# Patient Record
Sex: Female | Born: 1975 | Race: White | Hispanic: No | Marital: Married | Smoking: Former smoker
Health system: Southern US, Community
[De-identification: ages and names within clinical notes are randomized; demographics above are authoritative.]

## PROBLEM LIST (undated history)

## (undated) DIAGNOSIS — I214 Non-ST elevation (NSTEMI) myocardial infarction: Secondary | ICD-10-CM

## (undated) DIAGNOSIS — Z720 Tobacco use: Secondary | ICD-10-CM

## (undated) DIAGNOSIS — E785 Hyperlipidemia, unspecified: Secondary | ICD-10-CM

## (undated) DIAGNOSIS — I2510 Atherosclerotic heart disease of native coronary artery without angina pectoris: Secondary | ICD-10-CM

## (undated) DIAGNOSIS — I10 Essential (primary) hypertension: Secondary | ICD-10-CM

## (undated) HISTORY — DX: Tobacco use: Z72.0

## (undated) HISTORY — DX: Hyperlipidemia, unspecified: E78.5

## (undated) HISTORY — PX: CHOLECYSTECTOMY: SHX55

## (undated) HISTORY — DX: Atherosclerotic heart disease of native coronary artery without angina pectoris: I25.10

---
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
## 2000-05-11 ENCOUNTER — Other Ambulatory Visit: Admission: RE | Admit: 2000-05-11 | Discharge: 2000-05-11 | Payer: Self-pay | Admitting: Obstetrics and Gynecology

## 2001-02-15 ENCOUNTER — Inpatient Hospital Stay (HOSPITAL_COMMUNITY): Admission: AD | Admit: 2001-02-15 | Discharge: 2001-02-15 | Payer: Self-pay | Admitting: Obstetrics and Gynecology

## 2001-02-16 ENCOUNTER — Inpatient Hospital Stay (HOSPITAL_COMMUNITY): Admission: AD | Admit: 2001-02-16 | Discharge: 2001-02-16 | Payer: Self-pay | Admitting: Obstetrics and Gynecology

## 2001-02-23 ENCOUNTER — Inpatient Hospital Stay (HOSPITAL_COMMUNITY): Admission: AD | Admit: 2001-02-23 | Discharge: 2001-02-25 | Payer: Self-pay | Admitting: Obstetrics and Gynecology

## 2001-04-08 ENCOUNTER — Other Ambulatory Visit: Admission: RE | Admit: 2001-04-08 | Discharge: 2001-04-08 | Payer: Self-pay | Admitting: Obstetrics and Gynecology

## 2002-03-17 ENCOUNTER — Other Ambulatory Visit: Admission: RE | Admit: 2002-03-17 | Discharge: 2002-03-17 | Payer: Self-pay | Admitting: Obstetrics and Gynecology

## 2002-09-22 ENCOUNTER — Inpatient Hospital Stay (HOSPITAL_COMMUNITY): Admission: AD | Admit: 2002-09-22 | Discharge: 2002-09-24 | Payer: Self-pay | Admitting: Obstetrics and Gynecology

## 2002-10-31 ENCOUNTER — Other Ambulatory Visit: Admission: RE | Admit: 2002-10-31 | Discharge: 2002-10-31 | Payer: Self-pay | Admitting: Obstetrics and Gynecology

## 2003-11-11 ENCOUNTER — Encounter (INDEPENDENT_AMBULATORY_CARE_PROVIDER_SITE_OTHER): Payer: Self-pay | Admitting: Specialist

## 2003-11-12 ENCOUNTER — Observation Stay (HOSPITAL_COMMUNITY): Admission: EM | Admit: 2003-11-12 | Discharge: 2003-11-12 | Payer: Self-pay | Admitting: Emergency Medicine

## 2004-06-16 ENCOUNTER — Other Ambulatory Visit: Admission: RE | Admit: 2004-06-16 | Discharge: 2004-06-16 | Payer: Self-pay | Admitting: Obstetrics and Gynecology

## 2004-09-13 ENCOUNTER — Other Ambulatory Visit: Admission: RE | Admit: 2004-09-13 | Discharge: 2004-09-13 | Payer: Self-pay | Admitting: Obstetrics and Gynecology

## 2005-07-13 ENCOUNTER — Other Ambulatory Visit: Admission: RE | Admit: 2005-07-13 | Discharge: 2005-07-13 | Payer: Self-pay | Admitting: Obstetrics and Gynecology

## 2006-01-25 ENCOUNTER — Inpatient Hospital Stay (HOSPITAL_COMMUNITY): Admission: AD | Admit: 2006-01-25 | Discharge: 2006-01-26 | Payer: Self-pay | Admitting: Obstetrics and Gynecology

## 2007-07-01 ENCOUNTER — Emergency Department (HOSPITAL_COMMUNITY): Admission: EM | Admit: 2007-07-01 | Discharge: 2007-07-01 | Payer: Self-pay | Admitting: Emergency Medicine

## 2010-02-08 ENCOUNTER — Encounter (INDEPENDENT_AMBULATORY_CARE_PROVIDER_SITE_OTHER): Payer: Self-pay | Admitting: Obstetrics and Gynecology

## 2010-02-08 ENCOUNTER — Inpatient Hospital Stay (HOSPITAL_COMMUNITY): Admission: RE | Admit: 2010-02-08 | Discharge: 2010-02-10 | Payer: Self-pay | Admitting: Family Medicine

## 2010-08-11 LAB — CBC
HCT: 30 % — ABNORMAL LOW (ref 36.0–46.0)
Hemoglobin: 10.4 g/dL — ABNORMAL LOW (ref 12.0–15.0)
Hemoglobin: 12 g/dL (ref 12.0–15.0)
MCH: 33.5 pg (ref 26.0–34.0)
RBC: 3.62 MIL/uL — ABNORMAL LOW (ref 3.87–5.11)
RDW: 13.7 % (ref 11.5–15.5)
WBC: 12 10*3/uL — ABNORMAL HIGH (ref 4.0–10.5)

## 2010-08-11 LAB — ABO/RH: ABO/RH(D): A POS

## 2010-08-11 LAB — BASIC METABOLIC PANEL
BUN: 5 mg/dL — ABNORMAL LOW (ref 6–23)
CO2: 22 mEq/L (ref 19–32)
Chloride: 105 mEq/L (ref 96–112)
GFR calc Af Amer: >60 mL/min (ref >60)
Glucose, Bld: 91 mg/dL (ref 70–99)
Potassium: 4.1 mEq/L (ref 3.5–5.1)
Sodium: 135 mEq/L (ref 135–145)

## 2010-08-11 LAB — SURGICAL PCR SCREEN: MRSA, PCR: NEGATIVE

## 2010-08-11 LAB — TYPE AND SCREEN: Antibody Screen: NEGATIVE

## 2010-10-14 NOTE — H&P (Signed)
   Isabel [name], Isabel [name]                        ACCOUNT NO.:  [id number]   MEDICAL RECORD NO.:  [id number]                   PATIENT TYPE:  INP   LOCATION:  9163                                 FACILITY:  [facility]   PHYSICIAN:  Dineen Kid. Rana Snare, M.D.                 DATE OF BIRTH:  1975-06-03   DATE OF ADMISSION:  09/22/2002  DATE OF DISCHARGE:                                HISTORY & PHYSICAL   HISTORY OF PRESENT ILLNESS:  The patient is a 35 year old G2 P1 at 37-2/7  weeks who was seen in the office today with continued contractions  throughout the weekend.  Her pregnancy has been complicated by preterm  labor; stopped her terbutaline at 36 weeks.  She has now had prolonged  latent labor, is miserable and unable to sleep and unable to function due to  the fact that she is contracting all the time and feeling pain and pressure.  She also had a large gestational infant with last ultrasound on 09/16/2002  showing a 7-pound 2-ounce boy with an AFI of 21.6.  Estimated date of  confinement is 10/11/2002.  She presents for augmentation of latent labor.   PAST MEDICAL HISTORY:  Negative.   PAST SURGICAL HISTORY:  Negative.   PAST OBSTETRICAL HISTORY:  She had a vaginal delivery which was a vacuum  assisted of a 7-pound 1-ounce infant in 2002.   MEDICATIONS:  Prenatal vitamins.   ALLERGIES:  She has no known drug allergies.   LABORATORIES:  Blood type is A positive, rubella immune, hepatitis B  negative.   PHYSICAL EXAMINATION:  VITAL SIGNS:  Blood pressure is 118/82.  ABDOMEN:  Gravid, nontender, vertex presentation.  CERVIX:  Closed, 50%, -3.   MONITORING:  Contractions every 5 minutes on the monitor.   IMPRESSION AND PLAN:  Intrauterine pregnancy at 37-1/2 weeks with prolonged  latent labor.  The patient adamantly desires augmentation due to favorable  cervix, prolonged labor, and discomfort due to the large gestational age  infant, understands the risk of prematurity or risk of  fetal lung immaturity  at 37-1/2 weeks and again wished to proceed with amniotomy.  Unknown group B  strep status, we will place her on IV antibiotics prophylaxis for unknown  group B strep, and anticipate vaginal delivery.                                               Dineen Kid Rana Snare, M.D.    DCL/MEDQ  D:  09/22/2002  T:  09/22/2002  Job:  [id number]

## 2010-10-14 NOTE — Op Note (Signed)
NAMECATHELEEN, Isabel [name]                        ACCOUNT NO.:  [id number]   MEDICAL RECORD NO.:  [id number]                   PATIENT TYPE:  INP   LOCATION:  0101                                 [facility]AC[facility]   PHYSICIAN:  Leonie Man, M.D.                DATE OF BIRTH:  [birth year]   DATE OF PROCEDURE:  11/11/2003  DATE OF DISCHARGE:                                 OPERATIVE REPORT   PREOPERATIVE DIAGNOSIS:  Acute cholecystitis.   POSTOPERATIVE DIAGNOSIS:  Acute cholecystitis.   PROCEDURE:  Laparoscopic cholecystectomy with intraoperative cholangiogram.   SURGEON:  Luisa Hart L. Lurene Shadow, M.D.   ASSISTANT:  Consuello Bossier, M.D.   ANESTHESIA:  General.   NOTE:  Isabel [name] is a 35 year old female, who began having severe right  upper quadrant pain, associated with nausea but without vomiting, and this  has been persistent since June 13.  She has a prior history of having had  some colicky symptoms in the past, but this had usually subsided within 24  hours.  This time, she was evaluated by abdominal ultrasound which showed  cholelithiasis with a stone wedged in the neck of the gallbladder.  She was  referred for evaluation and on evaluation, she was noted to be afebrile with  no leukocytosis.  She did have elevated liver function studies with her SGOT  162, SGPT 165; a total bilirubin was elevated to 1.6, and the alkaline  phosphatase was elevated to 185.   The risks and potential benefits of cholecystectomy were discussed with her  and with her husband.  They gave consent to surgery, and she comes now to  the operating room for laparoscopic cholecystectomy.   DESCRIPTION OF PROCEDURE:  Following the induction of satisfactory general  anesthesia with the patient positioned supinely, the abdomen is prepped and  draped to be included in a sterile operative field.  Open laparoscopy was  created by an infraumbilical incision with an insertion of a Hasson cannula.  Insufflation  of the peritoneal cavity to 14-15 mmHg pressure using carbon  dioxide.  Camera is inserted and visual exploration of the abdomen carried  out.  The liver edge was somewhat rounded.  Liver surface was smooth.  The  gallbladder was acutely inflamed.  There was a significant amount of free  fluid around the gallbladder.  The small and large intestine appeared to be  normal.  On visualization of the pelvis, the pelvic organs were not  visualized.  There were some small adhesions in the pelvis from previous  cesarean sections.  Under direct vision, epigastric and lateral ports are  placed.  The gallbladder was extremely tense and hydropic and had to be  drained by placing an aspiration trocar through the fundus of the  gallbladder.  This was aspirated of large amount of purulent-looking bile.  The gallbladder is grasped and then retracted cephalad.  It has multiple  adhesions to the gallbladder, taking  it in a phlegmon, were taken down.  Dissection was taken down to the ampulla, and there was good visualization  of the anatomy in this area.  The cystic duct and cystic artery were  dissected free, and the cystic artery was doubly clipped and transected, the  cystic duct clipped proximally and opened.  I did a cystic duct  cholangiogram by passing a Reddick catheter into the abdomen and injecting  one-half strength Hypaque through the cystic duct.  The resulting  cholangiogram showed extrahepatic biliary system of normal caliber with no  filling defect.  There was prompt passage of contrast into the duodenum.  The cholangiocatheter was withdrawn, and the cystic duct was triply clipped  and transected.  The gallbladder was then dissected free from the liver bed  using electrocautery and maintaining hemostasis throughout the course of the  dissection.  The gallbladder was somewhat woody and acutely inflamed with  significant amounts of edema around the gallbladder.  After removal of the   gallbladder, the liver bed was checked for hemostasis and additional  bleeding points treated with electrocautery.  The right upper quadrant was  then irrigated with multiple aliquots of saline and aspirated.  The  gallbladder was placed in an EndoCatch and retrieved through the umbilicus.  The pneumoperitoneum was then allowed to deflate after the lateral trocars  were removed under direct vision.  The pneumoperitoneum having been  deflated, the wounds were closed in layers as follows:  The umbilical wound  in two layers with 0 Vicryl and a 4-0 Monocryl.  Epigastric and lateral  flank wounds closed with running 4-0 Monocryl sutures.  Sterile dressings  were placed on the wounds.  The anesthetic is reversed and the patient  removed from the operating room to the recovery room in stable condition.  She tolerated the procedure well.                                               Leonie Man, M.D.    PB/MEDQ  D:  11/11/2003  T:  11/11/2003  Job:  [id number]   cc:   Dr. Lurene Shadow (2 copies)

## 2010-10-14 NOTE — Op Note (Signed)
NAMEQUIN, MCPHERSON                        ACCOUNT NO.:  [id number]   MEDICAL RECORD NO.:  [id number]                   PATIENT TYPE:  INP   LOCATION:  9163                                 FACILITY:  [facility]   PHYSICIAN:  Dineen Kid. Rana Snare, M.D.                 DATE OF BIRTH:  Sep 18, 1975   DATE OF PROCEDURE:  09/23/2002  DATE OF DISCHARGE:                                 OPERATIVE REPORT   PREOPERATIVE DIAGNOSES:  1. Intrauterine pregnancy at 37-1/2 weeks.  2. Failure to progress.  3. Fetal intolerance to labor.   POSTOPERATIVE DIAGNOSES:  1. Intrauterine pregnancy at 37-1/2 weeks.  2. Failure to progress.  3. Fetal intolerance to labor.   PROCEDURE:  Primary low segment transverse cesarean section.   SURGEON:  Dineen Kid. Rana Snare, M.D.   ANESTHESIA:  Epidural.   INDICATIONS:  The patient is a 35 year old G2, P1 at 37-1/2 weeks who  presented in early labor.  She progressed to 3-4 cm at which time she had a  hand presentation.  The hand was manually reduced.  The patient subsequently  progressed to 5 cm, completely effaced, and 0 station at which time she  continued to contract approximately 250 [place] units for three to four  hours with no cervical change beyond 5 cm.  Began to have cervical edema and  failed to progress beyond 5 cm dilated despite adequate contractions.  Fetus  began having deep variables and because of failure to progress and fetal  intolerance to labor planned to proceed with primary low segment transverse  cesarean section.  Risks and benefits were discussed.  Informed consent was  obtained.   FINDINGS:  Viable female infant.  Apgars were 9 and 9.  Weight 7 pounds 3  ounces.  pH arterial 7.41.   ESTIMATED BLOOD LOSS:  800 mL.   DESCRIPTION OF PROCEDURE:  After adequate analgesia the patient was placed  in the supine position with left lateral tilt.  She was sterilely prepped  and draped.  Pfannenstiel skin incision was made two fingerbreadths above  the  pubic symphysis, taken down sharply.  The fascia was incised  transversely, extended superiorly and inferiorly off the bellies of the  rectus muscle which separated sharply in midline.  Peritoneum was entered  sharply.  Bladder blade was placed.  Uterine serosa was elevated, nicked,  and incised transversely.  Bladder flap was created and replaced behind the  bladder blade.  A low segment myotomy incision was made down to the infant's  vertex, extended laterally with the operator's fingertips.  Infant's vertex  was then delivered atraumatically.  The nares and pharynx suctioned.  The  infant was then delivered.  Cord clamped, cut, and cord blood was obtained  and the infant was handed to the pediatricians for resuscitation.  Placenta  was extracted manually.  Uterus was exteriorized, wiped clean with a dry  lap.  Myotomy incision  was closed in three layers, the first being a running  locking layer, the second two layers being imbricating layers.  After  adequate hemostasis was assured uterus was placed back into the peritoneal  cavity.  After copious amount of irrigation and adequate hemostasis the  peritoneum was closed with 0 Monocryl.  Rectus muscle plicated in the  midline.  Irrigation was applied and after adequate hemostasis the fascia  was closed with a 0 PDS in a running fashion.  Irrigation was applied and  after adequate hemostasis the skin was stapled, Steri-Strips applied.  The  patient tolerated the procedure well.  Was stable on transfer to the  recovery room.  Sponge and instrument count was normal x3.  Estimated blood  loss during the procedure was 800 mL.  The patient received 1 g of Cefotetan  after delivery of the placenta.                                               Dineen Kid Rana Snare, M.D.    DCL/MEDQ  D:  09/23/2002  T:  09/23/2002  Job:  [id number]

## 2010-10-14 NOTE — Discharge Summary (Signed)
NAMEJELIYAH, MIDDLEBROOKS                        ACCOUNT NO.:  [id number]   MEDICAL RECORD NO.:  [id number]                   PATIENT TYPE:  INP   LOCATION:  9119                                 FACILITY:  [facility]   PHYSICIAN:  Michelle L. Vincente Poli, M.D.            DATE OF BIRTH:  05-21-1976   DATE OF ADMISSION:  09/22/2002  DATE OF DISCHARGE:  09/24/2002                                 DISCHARGE SUMMARY   ADMISSION DIAGNOSES:  1. Intrauterine pregnancy at 76 and 2/7 weeks estimated gestational age.  2. Spontaneous onset of labor.   DISCHARGE DIAGNOSES:  1. Status post low transverse cesarean section secondary to failure to     progress and fetal intolerance to labor.  2. Viable female infant.   PROCEDURE:  Primary low transverse cesarean section.   REASON FOR ADMISSION:  Please see written H&P.   HOSPITAL COURSE:  The patient was a 35 year old white married female gravida  2, para 1 who presented to [facility] at 25 1/2 weeks estimated  gestational age in early labor.  Group B beta strep culture results were  unknown and the patient was treated prophylactically with antibiotics.  Artificial rupture of membranes was performed revealing clear fluid.  Fetal  heart tones were reassuring.  The patient did progress to 3-4 cm dilated at  which time she was noted to have a hand presentation.  The hand with manual  labor reduced.  The patient did progress to 5 cm and 100% effaced with  vertex at a 0 station.  After three to four hours of inadequate labor as  determined by intrauterine pressure catheter, the patient did not note  further progress.  Cervix began to become edematous and fetus was noted to  have deep variable decelerations on the monitor.  Decision was then made to  proceed with cesarean delivery.  The patient was taken to the operating room  where epidural was dosed to an adequate surgical level.  A low transverse  incision was made with a delivery of a viable female infant  weighing 7 pounds  3 ounces with Apgar's of 9 at one minute and 9 at five minutes.  Umbilical  cord pH was 7.41.  The patient tolerated procedure well and was taken to the  recovery room in stable condition.  Later that morning, the patient was then  transferred to the mother baby unit where vital signs were stable and the  patient was afebrile.  Fundus was firm and nontender.  Abdomen was soft.  She had good return of bowel function.  Abdominal  dressing was clean, dry  and intact.  On the following morning, the patient was without complaint.  She desired early discharge.  Vital signs were stable.  She was afebrile.  Fundus was firm and nontender.  There was a small amount of drainage noted  on abdominal dressing which was renewed and revealed an incision that clean,  dry and intact.   Labs revealed hemoglobin 8.4, platelet count 213,000.  WBC count 11.8.  The  patient did tolerate a regular diet without  complaints of nausea and  vomiting and the patient was later discharged that afternoon.   CONDITION ON DISCHARGE:  Good.   DIET:  Regular as tolerated.   ACTIVITY:  No heavy lifting, no driving x 2 weeks, no vaginal entry.   FOLLOW UP:  The patient is to follow up in the office in one week for an  incision check.  She is to call for temperature greater than 100 degrees,  persistent nausea and vomiting, heavy vaginal bleeding, any redness,  drainage from incisional site.   DISCHARGE MEDICATIONS:  1. Percocet 5/325 #30, one p.o. every four to six hours p.r.n. pain.  2. Motrin 600 mg every six hours p.r.n.  3. Prenatal vitamins one p.o. daily.  4. Colace one p.o. daily p.r.n.     Julio Sicks, N.P.                        Stann Mainland. Vincente Poli, M.D.    CC/MEDQ  D:  10/28/2002  T:  10/28/2002  Job:  [id number]

## 2010-10-14 NOTE — H&P (Signed)
Isabel [name], Isabel [name]              ACCOUNT NO.:  [id number]   MEDICAL RECORD NO.:  [id number]          PATIENT TYPE:  INP   LOCATION:  NA                            FACILITY:  [facility]   PHYSICIAN:  Dineen Kid. Rana Snare, M.D.    DATE OF BIRTH:  Feb 18, 1976   DATE OF ADMISSION:  01/25/2006  DATE OF DISCHARGE:                                HISTORY & PHYSICAL   HISTORY OF PRESENT ILLNESS:  Ms. Lippold is a 35 year old G3, P2 with EDC  of February 08, 2006, who presents for repeat cesarean section.  Her  pregnancy has been complicated by preterm labor and early labor, also  complicated by hypertension, currently on atenolol, history of migraines.  She has been on terbutaline until 36 weeks.  She now takes it as needed to  get to the scheduled C-section date.  Her cervix is 1+ long and -3.  She  track spots about 7 times an hour.  Ultrasound on January 08, 2006 shows  vertex presentation with a estimated fetal weight of 5 pounds, 12 ounces.  She has had regular followup throughout at the office with ultrasounds and  nonstress tests due to her hypertension.  Also, preterm labor.  She desires  for repeat cesarean section and presents for this.   PAST MEDICAL HISTORY:  Significant for hypertension.  Also, migraines.  Currently on atenolol 50 mg a day.  She had a cholecystectomy in June, 2005  with a C-section in 2004.  She desires repeat cesarean section.   MEDICATIONS:  Atenolol 50 mg a day.  Prenatal vitamins p.o. daily.   She has no known drug allergies.   PHYSICAL EXAMINATION:  VITAL SIGNS:  Blood pressure 100/74  LUNGS:  Clear to auscultation bilaterally.  HEART:  Regular rate and rhythm.  ABDOMEN:  Gravid, nontender.  PELVIC:  Cervix is 1, long, and -3 station.  Fetal heart tones in the 150s.   IMPRESSION/PLAN:  Intrauterine pregnancy at [redacted] weeks gestational age,  previous cesarean section, desires repeat cesarean section, preterm labor  and early labor.  Plan to proceed with repeat cesarean  section due to  regular contractions and early labor.  Risks and benefits of the procedure  were discussed at length, which included but were not limited to risk of  infection, bleeding, damage to the uterus, tubes, cervix, bowel, bladder,  fetus.  She does give her informed consent and wishes to proceed.      Dineen Kid Rana Snare, M.D.  Electronically Signed     DCL/MEDQ  D:  01/24/2006  T:  01/24/2006  Job:  [id number]

## 2010-10-14 NOTE — H&P (Signed)
NAMEEDLA, PARA                        ACCOUNT NO.:  [id number]   MEDICAL RECORD NO.:  [id number]                   PATIENT TYPE:  INP   LOCATION:  0101                                 FACILITY:  [facility]   PHYSICIAN:  Leonie Man, M.D.                DATE OF BIRTH:  18-Sep-1975   DATE OF ADMISSION:  11/11/2003  DATE OF DISCHARGE:                                HISTORY & PHYSICAL   CHIEF COMPLAINT:  Cholecystitis, cholelithiasis.   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 The pain has continued to worsen over this time and on abdominal  ultrasound, she is noted to have a cholelithiasis with stones wedge within  the neck off the gallbladder. She is admitted now and on admission she has  no leukocytosis although white blood cell count being 8.2, hemoglobin 12.5  and platelets 367.  Liver function studies are however abnormal with total  bilirubin elevated to 1.6, alkaline phosphatase 185, SGPT 165, SGOT 162.  Amylase is 42 and normal, lipase is 20 and normal. The patient did have a  prior history of having had some biliary colic which never lasted more than  24 hours and the pain was not quite as severe. She is a gravida 2, para 2  female.   PAST MEDICAL HISTORY:  Past surgery, cesarean section x1, spontaneous  vaginal delivery x1.   CURRENT MEDICATIONS:  Ketoprofen p.r.n.   ALLERGIES:  No known allergies.   REVIEW OF SYMPTOMS:  Negative in detail except as outlined.   PHYSICAL EXAMINATION:  GENERAL:  Well-developed and well-nourished white  female. She weighs approximately 200 pounds. She is alert and in no acute  distress.  VITAL SIGNS:  She is afebrile with temperature 98.1, pulses 97 and regular,  respirations 20, blood pressure 138/94.  HEENT:  Her head is normocephalic, her pupils are equal. The sclerae is  anicteric. No nasal obstruction, benign  oropharynx.  NECK:  Supple, no thyromegaly, no adenopathy.  CHEST:  Clear to auscultation bilaterally.  BREASTS:  Not examined.  HEART:  Regular rate and rhythm without murmurs heard.  ABDOMEN:  Tender in the epigastrium and the right upper quadrant. On  palpation, there is some degree of peritoneal irritation noted.  EXTREMITIES:  Range of motion within normal limits, no calf tenderness.  Distal pulses are equal bilaterally. Skin is normal.  NEUROLOGIC:  Gross neurologic examination shows no motor sensory deficits.   IMPRESSION:  Acute cholecystitis, rule out choledocholithiasis.   PLAN:  Laparoscopic cholecystectomy with intraoperative cholangiogram to be  attempted. I have discussed this with the patient and her spouse and consent  is obtained for surgery.  Leonie Man, M.D.    PB/MEDQ  D:  11/11/2003  T:  11/12/2003  Job:  [id number]

## 2010-10-14 NOTE — Discharge Summary (Signed)
Isabel [name], Isabel [name]              ACCOUNT NO.:  [id number]   MEDICAL RECORD NO.:  [id number]          PATIENT TYPE:  INP   LOCATION:                                FACILITY:  [facility]   PHYSICIAN:  Zelphia Cairo, MD         DATE OF BIRTH:   DATE OF ADMISSION:  01/25/2006  DATE OF DISCHARGE:  01/26/2006                                 DISCHARGE SUMMARY   ADMISSION DIAGNOSES:  1. Intrauterine pregnancy at 15 weeks estimated gestational age.  2. Early labor.  3. Previous cesarean section,  desires repeat for breech presentation.   DISCHARGE DIAGNOSIS:  1. Status post low transverse cesarean section.  2. Viable female infant.   PROCEDURE:  Repeat low transverse cesarean section.   REASON FOR ADMISSION:  Please see dictated H&P.   HOSPITAL COURSE:  The patient is a 35 year old gravida 3, para 2 who was  admitted to [facility] for a scheduled cesarean section.  The patient had a previous cesarean section and desired a repeat.  On the  morning of admission the patient was taken to the operating room where  spinal anesthesia was administered without difficulty.  A low transverse  incision was made, with the delivery of a viable female infant weighing 6  pounds 2 ounces with Apgars of 9 at one minute and 9 at five minutes.  The  patient tolerated  the procedure well and was taken to the recovery room in  stable condition.  On postoperative day one, the patient was without  complaints.  She did desire early discharge.   PHYSICAL EXAMINATION:  VITAL SIGNS:  Vital signs were stable.  Blood  pressure 92-114 over 51-52.  ABDOMEN:  Abdomen was soft.  Fundus firm and nontender.  Abdominal dressing  was mostly saturated with old drainage.  GENITOURINARY:  The Foley had been discontinued and the patient was voiding  well.   LABORATORY DATA:  The laboratory findings revealed a hemoglobin of 10.2,  platelet count 183,000, WBC count of 10.9.   The patient had  a chronic  hypertension and had been on atenolol.  The dose  was changed to 25 mg. The dressing was removed and IV was discontinued.  Later that evening the patient continued to desire early discharge.   DISCHARGE PHYSICAL EXAMINATION:  VITAL SIGNS:  Vital signs remained stable.  Blood pressure was now 110-114 over 51-65.  ABDOMEN:  Abdomen soft.  Incision was noted to be clean, dry and intact.   DISPOSITION:  Discharge instructions reviewed and the patient was later  discharged home.   CONDITION ON DISCHARGE:  Stable.   DIET:  Regular as tolerated.   ACTIVITY:  No heavy lifting, no driving x2 weeks, no vaginal entry.   FOLLOW UP:  Patient is to follow up in the office in three days for a blood  pressure check and staple removal.  She is to call for temperature greater  than 100 degrees, persistent __________ or  heavy vaginal bleeding and/or  redness or drainage from the incisional site.   DISCHARGE MEDICATIONS:  1. Atenolol 25  mg, one p.o. daily.  2. Tylox #30, one p.o. every 4-6 hours p.r.n.  3. Motrin 600 mg q.6h.  4. Colace one daily.      Julio Sicks, N.P.      Zelphia Cairo, MD  Electronically Signed    CC/MEDQ  D:  02/15/2006  T:  02/16/2006  Job:  [id number]

## 2010-10-14 NOTE — Op Note (Signed)
Isabel [name], Isabel [name]              ACCOUNT NO.:  [id number]   MEDICAL RECORD NO.:  [id number]          PATIENT TYPE:  INP   LOCATION:  9199                          FACILITY:  [facility]   PHYSICIAN:  Dineen Kid. Rana Snare, M.D.    DATE OF BIRTH:  05-May-1976   DATE OF PROCEDURE:  01/25/2006  DATE OF DISCHARGE:                                 OPERATIVE REPORT   PREOPERATIVE DIAGNOSES:  Intrauterine pregnancy at 38 weeks, early labor,  previous cesarean section and now breech presentation.   POSTOPERATIVE DIAGNOSES:  Intrauterine pregnancy at 38 weeks, early labor,  previous cesarean section and now breech presentation.   PROCEDURE:  Repeat low transverse caesarean section   SURGEON:  Dineen Kid. Rana Snare, M.D.   ANESTHESIA:  Spinal.   INDICATIONS:  Ms. Lober is a 35 year old G3, P2 at [redacted] weeks gestational  age.  Her pregnancy  has been complicated by a preterm labor and  hypertension, currently on Atenolol.  The cervix is currently 1, -3,  contractions up to 3 minutes now with breech presentation at 38 weeks.  Planned repeat cesarean section.  Risks and benefits were discussed and  informed consent was obtained.   FINDINGS:  At time of surgery, viable female infant in the frank breech  position.  Apgars were 9 and 9, pH arterial currently pending.  Weight is 6  pounds 2 ounces.   DESCRIPTION OF PROCEDURE:  After adequate analgesia, the patient was placed  in the supine position, left lateral tilt.  She was sterilely prepped and  draped.  The bladder was sterilely drained.  Previously sustained skin  incision was sharply excised.  It was then taken sharply down to the fascia  which was incised transversely and superiorly and inferiorly off the body of  the rectus muscles which were separated sharply in the midline.  Peritoneum  was entered sharply.  Bladder flap created and placed behind the bladder  blade. A low __________ incision was made down to the amniotic sac, extended  laterally with the  operator's fingertips.  The buttocks were easily  delivered up to the shoulder.  Arms were easily reduced, hip was easily  reduced as well.  The cord was clamped and cut.  Baby was bulb suctioned and  handed to the pediatrician for resuscitation.  Cord blood was obtained,  placenta extracted manually.  Uterus was exteriorized and wiped clean with  dry laps.  The myotomy incision was closed in two layers with first layer in  running locking layer and second imbricating layer of 0 Monocryl suture.  Uterus was placed back into the peritoneal cavity and after copious amount  of irrigation and adequate hemostasis was assured, the peritoneum was closed  with 0 Monocryl.  Rectus muscle was plicated in the midline.  Irrigation was  applied and after adequate hemostasis, the fascia was closed with 0 PDS in  running fashion.  Irrigation once again applied and after adequate hemostasis, skin staples  and Steri-Strips were applied.  The patient tolerated the procedure well,  was stable on transfer to the recovery room.  Sponge and instrument counts  were correct x3.  Estimated blood loss was 100 cc.  The patient received 1 g  of Rocephin after delivery of placenta.      Dineen Kid Rana Snare, M.D.  Electronically Signed     DCL/MEDQ  D:  01/25/2006  T:  01/25/2006  Job:  [id number]

## 2010-12-09 ENCOUNTER — Other Ambulatory Visit: Payer: Self-pay | Admitting: Obstetrics and Gynecology

## 2010-12-09 DIAGNOSIS — E878 Other disorders of electrolyte and fluid balance, not elsewhere classified: Secondary | ICD-10-CM

## 2010-12-09 DIAGNOSIS — R5383 Other fatigue: Secondary | ICD-10-CM

## 2010-12-09 DIAGNOSIS — R51 Headache: Secondary | ICD-10-CM

## 2010-12-13 ENCOUNTER — Emergency Department (HOSPITAL_COMMUNITY): Payer: Self-pay

## 2010-12-13 ENCOUNTER — Emergency Department (HOSPITAL_COMMUNITY)
Admission: EM | Admit: 2010-12-13 | Discharge: 2010-12-13 | Disposition: A | Discharge status: Home / Self Care | Payer: Self-pay | Attending: Emergency Medicine | Admitting: Emergency Medicine

## 2010-12-13 DIAGNOSIS — E785 Hyperlipidemia, unspecified: Secondary | ICD-10-CM | POA: Insufficient documentation

## 2010-12-13 DIAGNOSIS — I10 Essential (primary) hypertension: Secondary | ICD-10-CM | POA: Insufficient documentation

## 2010-12-13 DIAGNOSIS — R079 Chest pain, unspecified: Secondary | ICD-10-CM | POA: Insufficient documentation

## 2010-12-15 ENCOUNTER — Other Ambulatory Visit (HOSPITAL_COMMUNITY): Payer: Self-pay | Admitting: Obstetrics and Gynecology

## 2010-12-21 ENCOUNTER — Other Ambulatory Visit: Payer: Self-pay | Admitting: Obstetrics and Gynecology

## 2011-02-17 LAB — DIFFERENTIAL
Basophils Absolute: 0
Eosinophils Absolute: 0
Lymphs Abs: 0.6 — ABNORMAL LOW
Monocytes Absolute: 0.3
Monocytes Relative: 3
Neutro Abs: 10.3 — ABNORMAL HIGH
Neutrophils Relative %: 92 — ABNORMAL HIGH

## 2011-02-17 LAB — URINALYSIS, ROUTINE W REFLEX MICROSCOPIC
Bilirubin Urine: NEGATIVE
Leukocytes, UA: NEGATIVE
Nitrite: NEGATIVE
Specific Gravity, Urine: 1.033 — ABNORMAL HIGH

## 2011-02-17 LAB — COMPREHENSIVE METABOLIC PANEL
ALT: 13
Alkaline Phosphatase: 106
BUN: 6
Glucose, Bld: 106 — ABNORMAL HIGH
Potassium: 3.2 — ABNORMAL LOW
Total Protein: 7.4

## 2011-02-17 LAB — RPR: RPR Ser Ql: NONREACTIVE

## 2011-02-17 LAB — GC/CHLAMYDIA PROBE AMP, GENITAL
Chlamydia, DNA Probe: NEGATIVE
GC Probe Amp, Genital: NEGATIVE

## 2011-02-17 LAB — WET PREP, GENITAL
Clue Cells Wet Prep HPF POC: NONE SEEN
Trich, Wet Prep: NONE SEEN
Yeast Wet Prep HPF POC: NONE SEEN

## 2011-02-17 LAB — CBC
Hemoglobin: 13.2
MCHC: 34.6
RDW: 12.7
WBC: 11.2 — ABNORMAL HIGH

## 2011-02-17 LAB — PREGNANCY, URINE: Preg Test, Ur: NEGATIVE

## 2011-02-17 LAB — URINE MICROSCOPIC-ADD ON

## 2011-10-27 ENCOUNTER — Other Ambulatory Visit: Payer: Self-pay | Admitting: Obstetrics and Gynecology

## 2014-12-30 ENCOUNTER — Other Ambulatory Visit: Payer: Self-pay | Admitting: Obstetrics and Gynecology

## 2014-12-31 LAB — CYTOLOGY - PAP

## 2018-12-17 ENCOUNTER — Emergency Department (HOSPITAL_COMMUNITY)
Admission: EM | Admit: 2018-12-17 | Discharge: 2018-12-18 | Discharge status: Against Medical Advice | Payer: Medicaid Other | Source: Home / Self Care

## 2018-12-17 ENCOUNTER — Other Ambulatory Visit: Payer: Self-pay

## 2018-12-17 ENCOUNTER — Encounter (HOSPITAL_COMMUNITY): Payer: Self-pay

## 2018-12-17 ENCOUNTER — Emergency Department (HOSPITAL_COMMUNITY): Payer: Medicaid Other

## 2018-12-17 DIAGNOSIS — Z5321 Procedure and treatment not carried out due to patient leaving prior to being seen by health care provider: Secondary | ICD-10-CM | POA: Insufficient documentation

## 2018-12-17 LAB — BASIC METABOLIC PANEL
Anion gap: 9 (ref 5–15)
BUN: 8 mg/dL (ref 6–20)
CO2: 25 mmol/L (ref 22–32)
Calcium: 9.3 mg/dL (ref 8.9–10.3)
Chloride: 106 mmol/L (ref 98–111)
Creatinine, Ser: 0.83 mg/dL (ref 0.44–1.00)
GFR calc Af Amer: >60 mL/min (ref >60)
GFR calc non Af Amer: >60 mL/min (ref >60)
Glucose, Bld: 115 mg/dL — ABNORMAL HIGH (ref 70–99)
Potassium: 3.1 mmol/L — ABNORMAL LOW (ref 3.5–5.1)
Sodium: 140 mmol/L (ref 135–145)

## 2018-12-17 LAB — CBC
HCT: 43.2 % (ref 36.0–46.0)
Hemoglobin: 14.2 g/dL (ref 12.0–15.0)
MCH: 32.1 pg (ref 26.0–34.0)
MCHC: 32.9 g/dL (ref 30.0–36.0)
MCV: 97.7 fL (ref 80.0–100.0)
Platelets: 264 10*3/uL (ref 150–400)
RBC: 4.42 MIL/uL (ref 3.87–5.11)
RDW: 12.1 % (ref 11.5–15.5)
WBC: 11 10*3/uL — ABNORMAL HIGH (ref 4.0–10.5)
nRBC: 0 % (ref 0.0–0.2)

## 2018-12-17 LAB — TROPONIN I (HIGH SENSITIVITY): Troponin I (High Sensitivity): 23 ng/L — ABNORMAL HIGH (ref <18)

## 2018-12-17 LAB — I-STAT BETA HCG BLOOD, ED (MC, WL, AP ONLY): I-stat hCG, quantitative: <5 m[IU]/mL (ref <5)

## 2018-12-17 MED ORDER — SODIUM CHLORIDE 0.9% FLUSH: 3.0000 mL | Freq: Once | INTRAVENOUS | Status: DC

## 2018-12-17 NOTE — ED Notes (Signed)
HusbandCorene Cornea, [phone] for updates

## 2018-12-17 NOTE — ED Triage Notes (Signed)
Pt c.o chest pain that started around 330pm, pain radiates through to her back. Denies any other symptoms. Pt took a klonopin around 530pm to see if it would help with her symptoms and it did not. Pt a.o, nad noted

## 2018-12-18 NOTE — ED Notes (Signed)
Pt to the desk, stated she was leaving. Encouraged to stay, pt declined.

## 2018-12-19 ENCOUNTER — Inpatient Hospital Stay (HOSPITAL_COMMUNITY)
Admission: EM | Admit: 2018-12-19 | Discharge: 2018-12-21 | DRG: 247 | Disposition: A | Discharge status: Home / Self Care | Payer: Medicaid Other | Attending: Internal Medicine | Admitting: Internal Medicine

## 2018-12-19 ENCOUNTER — Encounter (HOSPITAL_COMMUNITY): Payer: Self-pay

## 2018-12-19 ENCOUNTER — Emergency Department (HOSPITAL_COMMUNITY): Payer: Medicaid Other

## 2018-12-19 ENCOUNTER — Other Ambulatory Visit: Payer: Self-pay

## 2018-12-19 DIAGNOSIS — F17210 Nicotine dependence, cigarettes, uncomplicated: Secondary | ICD-10-CM | POA: Diagnosis not present

## 2018-12-19 DIAGNOSIS — I160 Hypertensive urgency: Secondary | ICD-10-CM | POA: Diagnosis not present

## 2018-12-19 DIAGNOSIS — I10 Essential (primary) hypertension: Secondary | ICD-10-CM | POA: Diagnosis present

## 2018-12-19 DIAGNOSIS — Z1159 Encounter for screening for other viral diseases: Secondary | ICD-10-CM

## 2018-12-19 DIAGNOSIS — I214 Non-ST elevation (NSTEMI) myocardial infarction: Principal | ICD-10-CM | POA: Diagnosis present

## 2018-12-19 DIAGNOSIS — E785 Hyperlipidemia, unspecified: Secondary | ICD-10-CM

## 2018-12-19 DIAGNOSIS — I2510 Atherosclerotic heart disease of native coronary artery without angina pectoris: Secondary | ICD-10-CM

## 2018-12-19 DIAGNOSIS — E876 Hypokalemia: Secondary | ICD-10-CM | POA: Diagnosis not present

## 2018-12-19 DIAGNOSIS — I361 Nonrheumatic tricuspid (valve) insufficiency: Secondary | ICD-10-CM | POA: Diagnosis not present

## 2018-12-19 DIAGNOSIS — Z955 Presence of coronary angioplasty implant and graft: Secondary | ICD-10-CM

## 2018-12-19 HISTORY — DX: Essential (primary) hypertension: I10

## 2018-12-19 HISTORY — DX: Non-ST elevation (NSTEMI) myocardial infarction: I21.4

## 2018-12-19 LAB — SARS CORONAVIRUS 2 BY RT PCR (HOSPITAL ORDER, PERFORMED IN ~~LOC~~ HOSPITAL LAB): SARS Coronavirus 2: NEGATIVE

## 2018-12-19 LAB — BASIC METABOLIC PANEL
Anion gap: 13 (ref 5–15)
BUN: 12 mg/dL (ref 6–20)
CO2: 22 mmol/L (ref 22–32)
Calcium: 9.2 mg/dL (ref 8.9–10.3)
Chloride: 105 mmol/L (ref 98–111)
Creatinine, Ser: 0.96 mg/dL (ref 0.44–1.00)
GFR calc Af Amer: >60 mL/min (ref >60)
GFR calc non Af Amer: >60 mL/min (ref >60)
Glucose, Bld: 106 mg/dL — ABNORMAL HIGH (ref 70–99)
Potassium: 3.1 mmol/L — ABNORMAL LOW (ref 3.5–5.1)
Sodium: 140 mmol/L (ref 135–145)

## 2018-12-19 LAB — CBC
HCT: 41.8 % (ref 36.0–46.0)
Hemoglobin: 14.3 g/dL (ref 12.0–15.0)
MCH: 33 pg (ref 26.0–34.0)
MCHC: 34.2 g/dL (ref 30.0–36.0)
MCV: 96.5 fL (ref 80.0–100.0)
Platelets: 229 10*3/uL (ref 150–400)
RBC: 4.33 MIL/uL (ref 3.87–5.11)
RDW: 12 % (ref 11.5–15.5)
WBC: 9.9 10*3/uL (ref 4.0–10.5)
nRBC: 0 % (ref 0.0–0.2)

## 2018-12-19 LAB — TROPONIN I (HIGH SENSITIVITY)
Troponin I (High Sensitivity): 776 ng/L (ref <18)
Troponin I (High Sensitivity): 917 ng/L (ref <18)

## 2018-12-19 LAB — I-STAT BETA HCG BLOOD, ED (MC, WL, AP ONLY): I-stat hCG, quantitative: <5 m[IU]/mL (ref <5)

## 2018-12-19 MED ORDER — ASPIRIN EC 81 MG PO TBEC: 81.0000 mg | DELAYED_RELEASE_TABLET | Freq: Every day | ORAL | Status: DC

## 2018-12-19 MED ORDER — POTASSIUM CHLORIDE CRYS ER 20 MEQ PO TBCR
60.0000 meq | EXTENDED_RELEASE_TABLET | Freq: Once | ORAL | Status: AC
  Administered 2018-12-19: 60 meq via ORAL
  Filled 2018-12-19: qty 3

## 2018-12-19 MED ORDER — HYDRALAZINE HCL 20 MG/ML IJ SOLN
10.0000 mg | Freq: Once | INTRAMUSCULAR | Status: AC
  Administered 2018-12-19: 10 mg via INTRAVENOUS
  Filled 2018-12-19: qty 1

## 2018-12-19 MED ORDER — ATORVASTATIN CALCIUM 80 MG PO TABS: 80.0000 mg | ORAL_TABLET | Freq: Every day | ORAL | Status: DC

## 2018-12-19 MED ORDER — SODIUM CHLORIDE 0.9% FLUSH
3.0000 mL | Freq: Two times a day (BID) | INTRAVENOUS | Status: DC
  Administered 2018-12-19: 3 mL via INTRAVENOUS

## 2018-12-19 MED ORDER — SODIUM CHLORIDE 0.9 % IV SOLN: 250.0000 mL | INTRAVENOUS | Status: DC | PRN

## 2018-12-19 MED ORDER — AMLODIPINE BESYLATE 2.5 MG PO TABS
2.5000 mg | ORAL_TABLET | Freq: Every day | ORAL | Status: DC
  Administered 2018-12-19 (×2): 2.5 mg via ORAL
  Filled 2018-12-19 (×2): qty 1

## 2018-12-19 MED ORDER — SODIUM CHLORIDE 0.9 % WEIGHT BASED INFUSION
3.0000 mL/kg/h | INTRAVENOUS | Status: DC
  Administered 2018-12-20: 04:00:00 3 mL/kg/h via INTRAVENOUS

## 2018-12-19 MED ORDER — LABETALOL HCL 5 MG/ML IV SOLN
0.5000 mg/min | INTRAVENOUS | Status: DC
  Filled 2018-12-19: qty 100

## 2018-12-19 MED ORDER — SODIUM CHLORIDE 0.9 % WEIGHT BASED INFUSION: 1.0000 mL/kg/h | INTRAVENOUS | Status: DC

## 2018-12-19 MED ORDER — METOPROLOL TARTRATE 5 MG/5ML IV SOLN
5.0000 mg | Freq: Four times a day (QID) | INTRAVENOUS | Status: DC | PRN
  Administered 2018-12-20: 5 mg via INTRAVENOUS

## 2018-12-19 MED ORDER — METOPROLOL TARTRATE 25 MG PO TABS
25.0000 mg | ORAL_TABLET | Freq: Four times a day (QID) | ORAL | Status: DC
  Administered 2018-12-19 – 2018-12-21 (×6): 25 mg via ORAL
  Filled 2018-12-19 (×6): qty 1

## 2018-12-19 MED ORDER — ONDANSETRON HCL 4 MG/2ML IJ SOLN: 4.0000 mg | Freq: Four times a day (QID) | INTRAMUSCULAR | Status: DC | PRN

## 2018-12-19 MED ORDER — SPIRONOLACTONE 25 MG PO TABS
25.0000 mg | ORAL_TABLET | Freq: Two times a day (BID) | ORAL | Status: DC
  Administered 2018-12-19 – 2018-12-21 (×4): 25 mg via ORAL
  Filled 2018-12-19 (×4): qty 1

## 2018-12-19 MED ORDER — NITROGLYCERIN 0.4 MG SL SUBL: 0.4000 mg | SUBLINGUAL_TABLET | SUBLINGUAL | Status: DC | PRN

## 2018-12-19 MED ORDER — ASPIRIN 81 MG PO CHEW
324.0000 mg | CHEWABLE_TABLET | Freq: Once | ORAL | Status: AC
  Administered 2018-12-19: 17:00:00 324 mg via ORAL
  Filled 2018-12-19: qty 4

## 2018-12-19 MED ORDER — SODIUM CHLORIDE 0.9% FLUSH: 3.0000 mL | INTRAVENOUS | Status: DC | PRN

## 2018-12-19 MED ORDER — ZOLPIDEM TARTRATE 5 MG PO TABS: 5.0000 mg | ORAL_TABLET | Freq: Every evening | ORAL | Status: DC | PRN

## 2018-12-19 MED ORDER — AMLODIPINE BESYLATE 5 MG PO TABS
2.5000 mg | ORAL_TABLET | Freq: Once | ORAL | Status: AC
  Administered 2018-12-19: 2.5 mg via ORAL

## 2018-12-19 MED ORDER — ALPRAZOLAM 0.25 MG PO TABS
0.2500 mg | ORAL_TABLET | Freq: Two times a day (BID) | ORAL | Status: DC | PRN
  Administered 2018-12-20: 0.25 mg via ORAL

## 2018-12-19 MED ORDER — HYDRALAZINE HCL 20 MG/ML IJ SOLN: 10.0000 mg | INTRAMUSCULAR | Status: DC | PRN

## 2018-12-19 MED ORDER — METOPROLOL TARTRATE 5 MG/5ML IV SOLN
5.0000 mg | Freq: Once | INTRAVENOUS | Status: AC
  Administered 2018-12-19: 5 mg via INTRAVENOUS
  Filled 2018-12-19: qty 5

## 2018-12-19 MED ORDER — HEPARIN (PORCINE) 25000 UT/250ML-% IV SOLN
1200.0000 [IU]/h | INTRAVENOUS | Status: DC
  Administered 2018-12-19: 900 [IU]/h via INTRAVENOUS
  Filled 2018-12-19: qty 250

## 2018-12-19 MED ORDER — NITROGLYCERIN IN D5W 200-5 MCG/ML-% IV SOLN
0.0000 ug/min | INTRAVENOUS | Status: DC
  Administered 2018-12-19: 20 ug/min via INTRAVENOUS
  Filled 2018-12-19: qty 250

## 2018-12-19 MED ORDER — CARVEDILOL 6.25 MG PO TABS: 6.2500 mg | ORAL_TABLET | Freq: Two times a day (BID) | ORAL | Status: DC

## 2018-12-19 MED ORDER — HEPARIN BOLUS VIA INFUSION
4000.0000 [IU] | Freq: Once | INTRAVENOUS | Status: AC
  Administered 2018-12-19: 17:00:00 4000 [IU] via INTRAVENOUS
  Filled 2018-12-19: qty 4000

## 2018-12-19 MED ORDER — ASPIRIN 81 MG PO CHEW
81.0000 mg | CHEWABLE_TABLET | ORAL | Status: AC
  Administered 2018-12-20: 81 mg via ORAL
  Filled 2018-12-19: qty 1

## 2018-12-19 MED ORDER — ACETAMINOPHEN 325 MG PO TABS: 650.0000 mg | ORAL_TABLET | ORAL | Status: DC | PRN

## 2018-12-19 NOTE — ED Provider Notes (Addendum)
[facility] EMERGENCY DEPARTMENT Provider Note   CSN: [id number] Arrival date & time: 12/19/18  1506     History   Chief Complaint Chief Complaint  Patient presents with  . Chest Pain    HPI Isabel [name] is a 43 y.o. female.     HPI   43 year old female with chest pain.  Onset 2 days ago.  It happened early in the afternoon and then she came to the emergency room early that evening.  She had an EKG and labs drawn.  She unfortunately left prior to actually being seen because of the extended wait and her symptoms eventually subsided..  She followed up today to discuss her recent symptoms as well as address her hypertension.  Follow-up provider noted that her troponin drawn 2 days ago was mildly elevated and advised her to come to the emergency room.  She states that she has been actually been having intermittent chest pain for months. She has not noticed any appreciable exacerbating or relieving factors.  Her symptoms on Tuesday were more intense than what she had experienced previously which is what prompted her to seek evaluation.  She most recently had chest pain around 12 noon that she describes mild and this has completely gone away.   She has known hypertension.  She reports that she has not been on medications for several years though because of financial constraints.  No fevers, chills, cough, dyspnea, unusual leg pain or swelling.  Past Medical History:  Diagnosis Date  . Hypertension     There are no active problems to display for this patient.   Past Surgical History:  Procedure Laterality Date  . CESAREAN SECTION    . CHOLECYSTECTOMY       OB History   No obstetric history on file.      Home Medications    Prior to Admission medications   Not on File    Family History No family history on file.  Social History Social History   Tobacco Use  . Smoking status: Current Every Day Smoker    Packs/day: 1.00    Types: Cigarettes   . Smokeless tobacco: Never Used  Substance Use Topics  . Alcohol use: Never    Frequency: Never  . Drug use: Never     Allergies   Patient has no known allergies.   Review of Systems Review of Systems  All systems reviewed and negative, other than as noted in HPI.  Physical Exam Updated Vital Signs BP (!) 207/127   Pulse 88   Temp 99.2 F (37.3 C) (Oral)   Resp 10   Ht 5\' 5"  (1.651 m)   Wt 83.9 kg   LMP 12/06/2018 Comment: tubal ligation  SpO2 97%   BMI 30.79 kg/m   Physical Exam Vitals signs and nursing note reviewed.  Constitutional:      General: She is not in acute distress.    Appearance: She is well-developed.  HENT:     Head: Normocephalic and atraumatic.  Eyes:     General:        Right eye: No discharge.        Left eye: No discharge.     Conjunctiva/sclera: Conjunctivae normal.  Neck:     Musculoskeletal: Neck supple.  Cardiovascular:     Rate and Rhythm: Normal rate and regular rhythm.     Heart sounds: Normal heart sounds. No murmur. No friction rub. No gallop.   Pulmonary:     Effort:  Pulmonary effort is normal. No respiratory distress.     Breath sounds: Normal breath sounds.  Abdominal:     General: There is no distension.     Palpations: Abdomen is soft.     Tenderness: There is no abdominal tenderness.  Musculoskeletal:        General: No tenderness.  Skin:    General: Skin is warm and dry.  Neurological:     Mental Status: She is alert.  Psychiatric:        Behavior: Behavior normal.        Thought Content: Thought content normal.      ED Treatments / Results  Labs (all labs ordered are listed, but only abnormal results are displayed) Labs Reviewed  BASIC METABOLIC PANEL - Abnormal; Notable for the following components:      Result Value   Potassium 3.1 (*)    Glucose, Bld 106 (*)    All other components within normal limits  TROPONIN I (HIGH SENSITIVITY) - Abnormal; Notable for the following components:   Troponin I  (High Sensitivity) 776 (*)    All other components within normal limits  SARS CORONAVIRUS 2 (HOSPITAL ORDER, PERFORMED IN [facility])  CBC  HEPARIN LEVEL (UNFRACTIONATED)  I-STAT BETA HCG BLOOD, ED ([facility], [facility], [facility] ONLY)    EKG EKG Interpretation  Date/Time:  Thursday December 19 2018 15:13:03 EDT Ventricular Rate:  92 PR Interval:    QRS Duration: 83 QT Interval:  352 QTC Calculation: 436 R Axis:   83 Text Interpretation:  Sinus rhythm Biatrial enlargement Anterior infarct, old Confirmed by Raeford RazorKohut, Cathrine Krizan [phone]([id number]) on 12/19/2018 3:24:54 PM   Radiology Dg Chest 2 View  Result Date: 12/19/2018 CLINICAL DATA:  Chest pain for 2 days EXAM: CHEST - 2 VIEW COMPARISON:  12/17/2018 FINDINGS: Cardiac shadow is stable. The lungs are well aerated bilaterally. No focal infiltrate or effusion is seen. No bony abnormality is noted. No significant interval change from the prior exam is seen. IMPRESSION: No active cardiopulmonary disease. Electronically Signed   By: Alcide CleverMark  Lukens M.D.   On: 12/19/2018 16:03   Dg Chest 2 View  Result Date: 12/17/2018 CLINICAL DATA:  Chest pain EXAM: CHEST - 2 VIEW COMPARISON:  12/13/2010 FINDINGS: The heart size and mediastinal contours are within normal limits. Both lungs are clear. The visualized skeletal structures are unremarkable. IMPRESSION: No active cardiopulmonary disease. Electronically Signed   By: Alcide CleverMark  Lukens M.D.   On: 12/17/2018 20:21    Procedures Procedures (including critical care time)  CRITICAL CARE Performed by: Raeford RazorStephen Thaddus Mcdowell Total critical care time: 35 minutes Critical care time was exclusive of separately billable procedures and treating other patients. Critical care was necessary to treat or prevent imminent or life-threatening deterioration. Critical care was time spent personally by me on the following activities: development of treatment plan with patient and/or surrogate as well as nursing, discussions with consultants, evaluation  of patient's response to treatment, examination of patient, obtaining history from patient or surrogate, ordering and performing treatments and interventions, ordering and review of laboratory studies, ordering and review of radiographic studies, pulse oximetry and re-evaluation of patient's condition.   Medications Ordered in ED Medications  metoprolol tartrate (LOPRESSOR) injection 5 mg (has no administration in time range)  aspirin chewable tablet 324 mg (has no administration in time range)  potassium chloride SA (K-DUR) CR tablet 60 mEq (has no administration in time range)  nitroGLYCERIN 50 mg in dextrose 5 % 250 mL (0.2 mg/mL) infusion (has  no administration in time range)  heparin bolus via infusion 4,000 Units (has no administration in time range)  heparin ADULT infusion 100 units/mL (25000 units/25150mL sodium chloride 0.45%) (has no administration in time range)     Initial Impression / Assessment and Plan / ED Course  I have reviewed the triage vital signs and the nursing notes.  Pertinent labs & imaging results that were available during my care of the patient were reviewed by me and considered in my medical decision making (see chart for details).        43 year old female with chest pain.  Likely had MI two days ago.  Unfortunately, she left prior to being seen. Today her troponin is markedly elevated as compared to 2 days ago.  She is currently symptom-free.  Last had any chest discomfort around noon today but this was much milder than what she experienced 2 days ago.  Aspirin and heparin ordered.  Dose of metoprolol.  She is markedly hypertensive.  Given her waxing waning symptoms and blood pressure in this range, nitroglycerin drip was ordered as well.  Will discuss with cardiology. Admit.   Final Clinical Impressions(s) / ED Diagnoses   Final diagnoses:  NSTEMI (non-ST elevated myocardial infarction) (HCC)  Hypertension, unspecified type    ED Discharge Orders     None       Raeford RazorKohut, Demarion Pondexter, MD 12/19/18 1638    Raeford RazorKohut, Robinson Brinkley, MD 12/19/18 1642

## 2018-12-19 NOTE — ED Triage Notes (Addendum)
Pt arrived by EMS fom physician office. Had chest pain 7/22 was seen here in ED at [facility], [facility]. Went to f/u and she had elevated trop from 7/22. Today intermittent left sided chest pain last episode at 1200 today. No N/V, SOB,etc. Per ems bp 260/150. Not on HTN meds. Was given ASA 324mg  at pcp today

## 2018-12-19 NOTE — H&P (Addendum)
Cardiology Admission History and Physical:   Patient ID: Isabel [name]; MRN: [id number]; DOB: [birth year]   Admission date: 12/19/2018  Primary Care Provider: Patient, No Pcp Per  Primary Cardiologist: Dorris Carnes, MD new Primary Electrophysiologist:  None  Chief Complaint:  NSTEMI  Patient Profile:   Isabel [name] is a 43 y.o. female with a history of untreated hypertension and family history of premature coronary artery disease.  History of Present Illness:   Ms Isabel [name] has a history of HTN that is untreated   Diagnosed initaily during pregnancy   She says she   was in her usual state of health until Tuesday, 2 days ago.  She then had sudden onset of pain all across her chest.  It was sharp and stabbing and went through to her back.  It was hard to get a deep breath.  She had some diaphoresis, but no nausea or vomiting.  The pain began at about 2:30 in the afternoon.  When her symptoms did not resolve, she came to the emergency room about 7:30 PM.  In the emergency room, she had labs done, an EKG, and a chest x-ray.  She was not told that anything was abnormal and went home prior to being seen by the ER provider.  During the time that she was in the emergency room, she does not remember being given any medications.  However, she said the pain gradually eased off and by the time she left it was down to 1/10.  She had not been on blood pressure medication in over 4 years.  After the episode of chest pain, she kept having intermittent episodes of chest pain and was concerned about her blood pressure, which had been very high in the emergency room.  She went to see her PCP.  Her PCP saw her and reviewed her labs.  Her troponin was noted to be elevated and she was sent back to the emergency room.  [name] has been having 4-5 episodes of chest pain per day since she was in the ER on Tuesday.  She stated that her husband has not let her do anything so her activity level has been much  lower than usual.  However, she will get multiple episodes of chest pain during the day.  They are not as severe as her initial episode on Tuesday, but are otherwise the same.  The pain starts to the left of her upper sternum.  It is sharp.  The original pain was a 10/10.  The subsequent episodes have been a 1-2/10.  There is no change with deep inspiration or position.  The symptoms will resolve spontaneously.  They are not exertional or related to meals.  Of note, her troponin in the emergency room today was higher than the troponin in the emergency room 2 days ago.  She is being started on IV heparin.  Although she is currently asymptomatic, she is being started on IV nitroglycerin because her blood pressure is extremely high.   Past Medical History:  Diagnosis Date  . Hypertension   . NSTEMI (non-ST elevated myocardial infarction) ([place]) 12/19/2018    Past Surgical History:  Procedure Laterality Date  . CESAREAN SECTION    . CHOLECYSTECTOMY       Medications Prior to Admission: Prior to Admission medications   No blood pressure medications in greater than 4 years     Allergies:   No Known Allergies  Social History:   Social History   Socioeconomic History  .  Marital status: Married    Spouse name: Not on file  . Number of children: Not on file  . Years of education: Not on file  . Highest education level: Not on file  Occupational History  . 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 Financial resource strain: Not on file  . Food insecurity    Worry: Not on file    Inability: Not on file  . Transportation needs    Medical: Not on file    Non-medical: Not on file  Tobacco Use  . Smoking status: Current Every Day Smoker    Packs/day: 1.00    Types: Cigarettes  . Smokeless tobacco: Never Used  Substance and Sexual Activity  . Alcohol use: Never    Frequency: Never  . Drug use: Never  . Sexual activity: Not on file  Lifestyle  . Physical activity     Days per week: Not on file    Minutes per session: Not on file  . Stress: Not on file  Relationships  . Social Musicianconnections    Talks on phone: Not on file    Gets together: Not on file    Attends religious service: Not on file    Active member of club or organization: Not on file    Attends meetings of clubs or organizations: Not on file    Relationship status: Not on file  . Intimate partner violence    Fear of current or ex partner: Not on file    Emotionally abused: Not on file    Physically abused: Not on file    Forced sexual activity: Not on file  Other Topics Concern  . Not on file  Social History Narrative   Lives with husband and 4 sons    Family History:   The patient's family history includes Heart attack (age of onset: 6555) in her maternal grandmother; Heart attack (age of onset: 9064) in her mother; Heart attack (age of onset: 3572) in her maternal grandfather; Hypertension in her father, mother, and sister.   The patient She indicated that her mother is alive. She indicated that her father is alive. She indicated that her sister is alive. She indicated that her maternal grandmother is deceased. She indicated that her maternal grandfather is deceased.    ROS:  Please see the history of present illness.  All other ROS reviewed and negative.     Physical Exam/Data:   Vitals:   12/19/18 1511 12/19/18 1514 12/19/18 1515 12/19/18 1530  BP:  (!) 223/118  (!) 207/127  Pulse:  91  88  Resp:  18  10  Temp:  99.2 F (37.3 C)    TempSrc:  Oral    SpO2: 99% 99%  97%  Weight:   83.9 kg   Height:   5\' 5"  (1.651 m)    No intake or output data in the 24 hours ending 12/19/18 1723 Filed Weights   12/19/18 1515  Weight: 83.9 kg   Body mass index is 30.79 kg/m.  General:  Well nourished, well developed, in no acute distress HEENT: normal Lymph: no adenopathy Neck:  JVD not elevated Endocrine:  No thryomegaly Vascular: No carotid bruits; FA pulses 2+ bilaterally  Cardiac:   normal S1, S2; RRR; no murmur, no rub or gallop  Lungs:  clear to auscultation bilaterally, no wheezing, rhonchi or rales  Abd: soft, nontender, no hepatomegaly  Ext: no edema Musculoskeletal:  No deformities, BUE and BLE strength normal and  equal Skin: warm and dry  Neuro:  CNs 2-12 intact, no focal abnormalities noted Psych:  Normal affect    EKG:  The ECG that was done today was personally reviewed and demonstrates sinus rhythm, heart rate 92.  Q waves in V1, V2, and aVL are seen on 7/21 ECG 7/21 ECG is sinus tachycardia, heart rate 108, Inferior  ST depression  Relevant CV Studies:  None  Laboratory Data:  Chemistry Recent Labs  Lab 12/17/18 2004 12/19/18 1520  NA 140 140  K 3.1* 3.1*  CL 106 105  CO2 25 22  GLUCOSE 115* 106*  BUN 8 12  CREATININE 0.83 0.96  CALCIUM 9.3 9.2  GFRNONAA >60 >60  GFRAA >60 >60  ANIONGAP 9 13    No results for input(s): PROT, ALBUMIN, AST, ALT, ALKPHOS, BILITOT in the last 168 hours. Hematology Recent Labs  Lab 12/17/18 2004 12/19/18 1520  WBC 11.0* 9.9  RBC 4.42 4.33  HGB 14.2 14.3  HCT 43.2 41.8  MCV 97.7 96.5  MCH 32.1 33.0  MCHC 32.9 34.2  RDW 12.1 12.0  PLT 264 229   Cardiac Enzymes High Sensitivity Troponin:   Recent Labs  Lab 12/17/18 2004 12/19/18 1520  TROPONINIHS 23* 776*      Radiology/Studies:  Dg Chest 2 View  Result Date: 12/19/2018 CLINICAL DATA:  Chest pain for 2 days EXAM: CHEST - 2 VIEW COMPARISON:  12/17/2018 FINDINGS: Cardiac shadow is stable. The lungs are well aerated bilaterally. No focal infiltrate or effusion is seen. No bony abnormality is noted. No significant interval change from the prior exam is seen. IMPRESSION: No active cardiopulmonary disease. Electronically Signed   By: Alcide CleverMark  Lukens M.D.   On: 12/19/2018 16:03    Assessment and Plan:   1. Chest pain  Pt with new onset chest pain.   Stuttering  SOmehwat atypical for angina   Pt presented on Tuesday and then again today   Troponin  is elevated now compared to 2 days ago    May reflct subendocardial ischemia in setting of severe HTN    Cannot exlude ischemia due to CAD She has risk factors for CAD   Given this would set up for L heart cath to r/o CAD   REcomm: - Admit, continue to cycle enzymes, use heparin She is complaining of a headache with NTG   Will begin B BLocker as well as amlodipine . -Start high-dose statin and check a lipid profile as well as liver function tests - Add b blocker and amlodipine and titrate for heart rate and blood pressure control - Schedule echo  To eval LV anatomy and function   2.  Hypertension: -She has not been on medication in greater than 4 years. -Her blood pressure is extremely elevated today with a peak of 223/118  Consistent with hypertensive urgency - On 7/21 at her ER visit, her blood pressure was 140/60 -Add a beta-blocker as above and titrate to control blood pressure and lower heart rate - Note that potassium is low at 3.1   Will start aldactone   May need eval for hyperaldosteronism   3  Tobacco abuse   Counselled  on cessation    Principal Problem:   NSTEMI (non-ST elevated myocardial infarction) [facility](HCC) Active Problems:   Hypertension    For questions or updates, please contact [facility] Please consult [email or web address] for contact info under Cardiology/STEMI.    Signed, Theodore DemarkRhonda Barrett, PA-C  12/19/2018 5:23 PM   Pt seen and examined  I agree with findings as noted by R Barrett above   I have amended this note    Pt is a 43 yo with long hx of untreated HTN and tobacco abuse   [facility] the [facility]   Pain intermitt.   Today BP severely elevated    Pain was gone when I saw her She complained of a HA  ON exam  BP 230/110   P 80 Neck:   JVP is normal  No bruits Lungs are CTA  Cardiac RRR  NO S3   No signif murmurs Abd is benign Ext are without edema   Equal pulses throughout  EKG with inferior ST depression  TUesday,;  not today CXR shows cardiaomeg   Aorta appears normal in size Troponin on TUesday was normal; now 776 and 917    Plan as noted above  Admit   Treat with heparin Treat for hypertensive urgency   WIll add aldactone since K is low    Trop elevation may represent subendocardial ischemia in setting of severe HTN   But, with risk factors would recomm L heart cath to define anatomy Would sched echo to define LVEF and chamber sizes.    Dietrich PatesPaula Eddi Hymes MD

## 2018-12-19 NOTE — ED Notes (Addendum)
Notified Dr. Wilson Singer of pt's troponin of 776 and obtaining orders

## 2018-12-19 NOTE — ED Notes (Signed)
I didn't titrate pt's nitro drip up for elevated bp, because Dr. Harrington Challenger told me not to, due to pt having headache. Dr Harrington Challenger stated she will prescribe something else for bp.

## 2018-12-19 NOTE — Progress Notes (Signed)
ANTICOAGULATION CONSULT NOTE - Initial Consult  Pharmacy Consult for heparin Indication: chest pain/ACS  No Known Allergies  Patient Measurements: Height: 5\' 5"  (165.1 cm) Weight: 185 lb (83.9 kg) IBW/kg (Calculated) : 57 Heparin Dosing Weight: 75kg  Vital Signs: Temp: 99.2 F (37.3 C) (07/23 1514) Temp Source: Oral (07/23 1514) BP: 207/127 (07/23 1530) Pulse Rate: 88 (07/23 1530)  Labs: Recent Labs    12/17/18 2004 12/19/18 1520  HGB 14.2 14.3  HCT 43.2 41.8  PLT 264 229  CREATININE 0.83 0.96  TROPONINIHS 23* 776*    Estimated Creatinine Clearance: 81.7 mL/min (by C-G formula based on SCr of 0.96 mg/dL).   Medical History: Past Medical History:  Diagnosis Date  . Hypertension     Medications:  Infusions:  . heparin    . nitroGLYCERIN      Assessment: Pt is a 42YOF w/ c/o CP. Pt found to have elevated troponin's and a BP of 260/150. No anticoagulation PTA. Pt found to be having a STEMI. Hgb 14.3 and Plts 229. Pharmacy to dose heparin for STEMI.  Goal of Therapy:  Heparin level 0.3-0.7 units/ml Monitor platelets by anticoagulation protocol: Yes   Plan:  Heparin 4000 unit bolus IV x1 then heparin 900 units/hr IV Check 6 hour HL Monitor daily HL and CBC, s/sx of bleed  Natale Lay 12/19/2018,4:27 PM

## 2018-12-19 NOTE — ED Notes (Signed)
ED TO INPATIENT HANDOFF REPORT  ED Nurse Name and Phone #: Percival Spanishlana [phone]  S Name/Age/Gender Isabel [name] 43 y.o. female Room/Bed: 036C/036C  Code Status   Code Status: Not on file  Home/SNF/Other Home Patient oriented to: self, place, time and situation Is this baseline? Yes   Triage Complete: Triage complete  Chief Complaint cp + trop  Triage Note Pt arrived by EMS fom physician office. Had chest pain 7/22 was seen here in ED at 1930, LWBS. Went to f/u and she had elevated trop from 7/22. Today intermittent left sided chest pain last episode at 1200 today. No N/V, SOB,etc. Per ems bp 260/150. Not on HTN meds. Was given ASA 324mg  at pcp today   Allergies No Known Allergies  Level of Care/Admitting Diagnosis ED Disposition    ED Disposition Condition Comment   Admit  [facility]ospital Area: [facility] [[id number]]  Level of Care: Telemetry Cardiac [103]  Covid Evaluation: Asymptomatic Screening Protocol (No Symptoms)  Diagnosis: NSTEMI (non-ST elevated myocardial infarction) [facility](HCC) [[id number]]) [[id number]]  Admitting Physician: Abbey ChattersOSS, PAULA V [[id number]]  Attending Physician: Pricilla RiffleOSS, PAULA V [[id number]]  Estimated length of stay: past midnight tomorrow  Certification:: I certify this patient will need inpatient services for at least 2 midnights  PT Class (Do Not Modify): Inpatient [101]  PT Acc Code (Do Not Modify): Private [1]       B Medical/Surgery History Past Medical History:  Diagnosis Date  . Hypertension   . NSTEMI (non-ST elevated myocardial infarction) (HCC) 12/19/2018   Past Surgical History:  Procedure Laterality Date  . CESAREAN SECTION    . CHOLECYSTECTOMY       A IV Location/Drains/Wounds Patient Lines/Drains/Airways Status   Active Line/Drains/Airways    Name:   Placement date:   Placement time:   Site:   Days:   Peripheral IV 12/19/18 Left Antecubital   12/19/18    -    Antecubital   less than 1   Peripheral IV 12/19/18 Right Antecubital   12/19/18    1653     Antecubital   less than 1          Intake/Output Last 24 hours  Intake/Output Summary (Last 24 hours) at 12/19/2018 2147 Last data filed at 12/19/2018 1906 Gross per 24 hour  Intake 100 ml  Output -  Net 100 ml    Labs/Imaging Results for orders placed or performed during the hospital encounter of 12/19/18 (from the past 48 hour(s))  Basic metabolic panel     Status: Abnormal   Collection Time: 12/19/18  3:20 PM  Result Value Ref Range   Sodium 140 135 - 145 mmol/L   Potassium 3.1 (L) 3.5 - 5.1 mmol/L   Chloride 105 98 - 111 mmol/L   CO2 22 22 - 32 mmol/L   Glucose, Bld 106 (H) 70 - 99 mg/dL   BUN 12 6 - 20 mg/dL   Creatinine, Ser 7.820.96 0.44 - 1.00 mg/dL   Calcium 9.2 8.9 - 95.610.3 mg/dL   GFR calc non Af Amer >60 >60 mL/min   GFR calc Af Amer >60 >60 mL/min   Anion gap 13 5 - 15    Comment: Performed at [facility], [address] [place], [place] [address]  CBC     Status: None   Collection Time: 12/19/18  3:20 PM  Result Value Ref Range   WBC 9.9 4.0 - 10.5 K/uL   RBC 4.33 3.87 - 5.11 MIL/uL   Hemoglobin 14.3 12.0 -  15.0 g/dL   HCT 16.141.8 09.636.0 - 04.546.0 %   MCV 96.5 80.0 - 100.0 fL   MCH 33.0 26.0 - 34.0 pg   MCHC 34.2 30.0 - 36.0 g/dL   RDW 40.912.0 81.111.5 - 91.415.5 %   Platelets 229 150 - 400 K/uL   nRBC 0.0 0.0 - 0.2 %    Comment: Performed at [facility], [address] [place], [place] [address]  Troponin I (High Sensitivity)     Status: Abnormal   Collection Time: 12/19/18  3:20 PM  Result Value Ref Range   Troponin I (High Sensitivity) 776 (HH) <18 ng/L    Comment: CRITICAL RESULT CALLED TO, READ BACK BY AND VERIFIED WITH: E Jeramyah Goodpasture RN AT 1620 ON [id number] BY K FORSYTH (NOTE) Elevated high sensitivity troponin I (hsTnI) values and significant  changes across serial measurements may suggest ACS but many other  chronic and acute conditions are known to elevate hsTnI results.  Refer to the Links section for chest pain algorithms and additional   guidance. Performed at [facility], [address] [place], [place] [address]   I-Stat beta hCG blood, ED     Status: None   Collection Time: 12/19/18  3:26 PM  Result Value Ref Range   I-stat hCG, quantitative <5.0 <5 mIU/mL   Comment 3            Comment:   GEST. AGE      CONC.  (mIU/mL)   <=1 WEEK        5 - 50     2 WEEKS       50 - 500     3 WEEKS       100 - 10,000     4 WEEKS     1,000 - 30,000        FEMALE AND NON-PREGNANT FEMALE:     LESS THAN 5 mIU/mL   SARS Coronavirus 2 (CEPHEID - Performed in [facility] hospital lab), Hosp Order     Status: None   Collection Time: 12/19/18  5:00 PM   Specimen: Nasopharyngeal Swab  Result Value Ref Range   SARS Coronavirus 2 NEGATIVE NEGATIVE    Comment: (NOTE) If result is NEGATIVE SARS-CoV-2 target nucleic acids are NOT DETECTED. The SARS-CoV-2 RNA is generally detectable in upper and lower  respiratory specimens during the acute phase of infection. The lowest  concentration of SARS-CoV-2 viral copies this assay can detect is 250  copies / mL. A negative result does not preclude SARS-CoV-2 infection  and should not be used as the sole basis for treatment or other  patient management decisions.  A negative result may occur with  improper specimen collection / handling, submission of specimen other  than nasopharyngeal swab, presence of viral mutation(s) within the  areas targeted by this assay, and inadequate number of viral copies  (<250 copies / mL). A negative result must be combined with clinical  observations, patient history, and epidemiological information. If result is POSITIVE SARS-CoV-2 target nucleic acids are DETECTED. The SARS-CoV-2 RNA is generally detectable in upper and lower  respiratory specimens dur ing the acute phase of infection.  Positive  results are indicative of active infection with SARS-CoV-2.  Clinical  correlation with patient history and other diagnostic information is  necessary to  determine patient infection status.  Positive results do  not rule out bacterial infection or co-infection with other viruses. If result is PRESUMPTIVE POSTIVE SARS-CoV-2 nucleic acids MAY  BE PRESENT.   A presumptive positive result was obtained on the submitted specimen  and confirmed on repeat testing.  While 2019 novel coronavirus  (SARS-CoV-2) nucleic acids may be present in the submitted sample  additional confirmatory testing may be necessary for epidemiological  and / or clinical management purposes  to differentiate between  SARS-CoV-2 and other Sarbecovirus currently known to infect humans.  If clinically indicated additional testing with an alternate test  methodology [phone]([id number]) is advised. The SARS-CoV-2 RNA is generally  detectable in upper and lower respiratory sp ecimens during the acute  phase of infection. The expected result is Negative. Fact Sheet for Patients:  BoilerBrush.com.cyhttps://www.fda.gov/media/136312/download Fact Sheet for Healthcare Providers: https://pope.com/https://www.fda.gov/media/136313/download This test is not yet approved or cleared by the [place] FDA and has been authorized for detection and/or diagnosis of SARS-CoV-2 by FDA under an Emergency Use Authorization (EUA).  This EUA will remain in effect (meaning this test can be used) for the duration of the COVID-19 declaration under Section 564(b)(1) of the Act, 21 U.S.C. section 360bbb-3(b)(1), unless the authorization is terminated or revoked sooner. Performed at [facility], [address] [place], [place] [address]   Troponin I (High Sensitivity)     Status: Abnormal   Collection Time: 12/19/18  5:00 PM  Result Value Ref Range   Troponin I (High Sensitivity) 917 (HH) <18 ng/L    Comment: CRITICAL VALUE NOTED.  VALUE IS CONSISTENT WITH PREVIOUSLY REPORTED AND CALLED VALUE. (NOTE) Elevated high sensitivity troponin I (hsTnI) values and significant  changes across serial measurements may suggest ACS but many  other  chronic and acute conditions are known to elevate hsTnI results.  Refer to the Links section for chest pain algorithms and additional  guidance. Performed at [facility], [address] [place], [place] [address]    Dg Chest 2 View  Result Date: 12/19/2018 CLINICAL DATA:  Chest pain for 2 days EXAM: CHEST - 2 VIEW COMPARISON:  12/17/2018 FINDINGS: Cardiac shadow is stable. The lungs are well aerated bilaterally. No focal infiltrate or effusion is seen. No bony abnormality is noted. No significant interval change from the prior exam is seen. IMPRESSION: No active cardiopulmonary disease. Electronically Signed   By: Alcide CleverMark  Lukens M.D.   On: 12/19/2018 16:03    Pending Labs Unresulted Labs (From admission, onward)    Start     Ordered   12/20/18 0500  Heparin level (unfractionated)  Daily,   R     12/19/18 1627   12/20/18 0500  CBC  Daily,   R     12/19/18 1627   12/19/18 2300  Heparin level (unfractionated)  Once-Timed,   STAT     12/19/18 1627   Signed and Held  HIV antibody (Routine Testing)  Once,   R     Signed and Held   Signed and Held  Comprehensive metabolic panel  Tomorrow morning,   R     Signed and Held   Signed and Held  Occult blood card to lab, stool  As needed,   R     Signed and Held   Signed and Held  TSH  Once,   R     Signed and Held   Signed and Held  Hemoglobin A1c  Once,   R     Signed and Held   Signed and Held  Lipid panel  Tomorrow morning,   R     Signed and [employer] EnergyHeld   Signed and Held  Protime-INR  ONCE - STAT,   STAT     Signed and Held   Signed and Held  SARS Coronavirus 2 (Performed in [place] hospital lab)  (COVID Labs)  Once,   R    Question:  Pre-procedural testing  Answer:  Yes   Signed and Held          Vitals/Pain Today's Vitals   12/19/18 2100 12/19/18 2115 12/19/18 2130 12/19/18 2143  BP: (!) 172/105 (!) 202/104 (!) 179/101   Pulse: 70 74 69 71  Resp: 12 15 18    Temp:      TempSrc:      SpO2: 98% 99% 97%   Weight:       Height:      PainSc:        Isolation Precautions No active isolations  Medications Medications  heparin ADULT infusion 100 units/mL (25000 units/268mL sodium chloride 0.45%) (900 Units/hr Intravenous New Bag/Given 12/19/18 1647)  metoprolol tartrate (LOPRESSOR) injection 5 mg (has no administration in time range)  metoprolol tartrate (LOPRESSOR) tablet 25 mg (25 mg Oral Given 12/19/18 2143)  amLODipine (NORVASC) tablet 2.5 mg (2.5 mg Oral Given 12/19/18 1958)  metoprolol tartrate (LOPRESSOR) injection 5 mg (5 mg Intravenous Given 12/19/18 1642)  aspirin chewable tablet 324 mg (324 mg Oral Given 12/19/18 1641)  potassium chloride SA (K-DUR) CR tablet 60 mEq (60 mEq Oral Given 12/19/18 1641)  heparin bolus via infusion 4,000 Units (4,000 Units Intravenous Bolus from Bag 12/19/18 1647)  amLODipine (NORVASC) tablet 2.5 mg (2.5 mg Oral Given 12/19/18 1959)  hydrALAZINE (APRESOLINE) injection 10 mg (10 mg Intravenous Given 12/19/18 2017)    Mobility walks Low fall risk   Focused Assessments Cardiac Assessment Handoff:  Cardiac Rhythm: Normal sinus rhythm No results found for: CKTOTAL, CKMB, CKMBINDEX, TROPONINI No results found for: DDIMER Does the Patient currently have chest pain? No      R Recommendations: See Admitting Provider Note  Report given to:   Additional Notes:

## 2018-12-19 NOTE — ED Notes (Signed)
I paged Dr. Kalman Shan from cardiology about pt's elevated bp and she stated she will get back to me with orders. I'm waiting to obtain orders from her.

## 2018-12-20 ENCOUNTER — Encounter (HOSPITAL_COMMUNITY): Payer: Self-pay

## 2018-12-20 ENCOUNTER — Inpatient Hospital Stay (HOSPITAL_COMMUNITY): Payer: Medicaid Other

## 2018-12-20 ENCOUNTER — Encounter (HOSPITAL_COMMUNITY)
Admission: EM | Disposition: A | Discharge status: Home / Self Care | Payer: Self-pay | Source: Home / Self Care | Attending: Internal Medicine

## 2018-12-20 DIAGNOSIS — E785 Hyperlipidemia, unspecified: Secondary | ICD-10-CM

## 2018-12-20 DIAGNOSIS — I361 Nonrheumatic tricuspid (valve) insufficiency: Secondary | ICD-10-CM

## 2018-12-20 DIAGNOSIS — I2510 Atherosclerotic heart disease of native coronary artery without angina pectoris: Secondary | ICD-10-CM

## 2018-12-20 DIAGNOSIS — Z720 Tobacco use: Secondary | ICD-10-CM

## 2018-12-20 DIAGNOSIS — I10 Essential (primary) hypertension: Secondary | ICD-10-CM

## 2018-12-20 HISTORY — PX: CORONARY STENT INTERVENTION: CATH118234

## 2018-12-20 HISTORY — PX: LEFT HEART CATH AND CORONARY ANGIOGRAPHY: CATH118249

## 2018-12-20 LAB — COMPREHENSIVE METABOLIC PANEL
ALT: 12 U/L (ref 0–44)
AST: 20 U/L (ref 15–41)
Albumin: 3.7 g/dL (ref 3.5–5.0)
Alkaline Phosphatase: 62 U/L (ref 38–126)
Anion gap: 14 (ref 5–15)
BUN: 13 mg/dL (ref 6–20)
CO2: 15 mmol/L — ABNORMAL LOW (ref 22–32)
Calcium: 9.2 mg/dL (ref 8.9–10.3)
Chloride: 110 mmol/L (ref 98–111)
Creatinine, Ser: 0.77 mg/dL (ref 0.44–1.00)
GFR calc Af Amer: >60 mL/min (ref >60)
GFR calc non Af Amer: >60 mL/min (ref >60)
Glucose, Bld: 89 mg/dL (ref 70–99)
Potassium: 3.8 mmol/L (ref 3.5–5.1)
Sodium: 139 mmol/L (ref 135–145)
Total Bilirubin: 0.5 mg/dL (ref 0.3–1.2)
Total Protein: 6.8 g/dL (ref 6.5–8.1)

## 2018-12-20 LAB — CBC
HCT: 41.2 % (ref 36.0–46.0)
Hemoglobin: 14 g/dL (ref 12.0–15.0)
MCH: 32.4 pg (ref 26.0–34.0)
MCHC: 34 g/dL (ref 30.0–36.0)
MCV: 95.4 fL (ref 80.0–100.0)
Platelets: 242 10*3/uL (ref 150–400)
RBC: 4.32 MIL/uL (ref 3.87–5.11)
RDW: 12 % (ref 11.5–15.5)
WBC: 12.5 10*3/uL — ABNORMAL HIGH (ref 4.0–10.5)
nRBC: 0 % (ref 0.0–0.2)

## 2018-12-20 LAB — ECHOCARDIOGRAM COMPLETE
Height: 65 in
Weight: 2912 [oz_av]

## 2018-12-20 LAB — PROTIME-INR
INR: 1 (ref 0.8–1.2)
Prothrombin Time: 13.3 seconds (ref 11.4–15.2)

## 2018-12-20 LAB — LIPID PANEL
Cholesterol: 206 mg/dL — ABNORMAL HIGH (ref 0–200)
HDL: 48 mg/dL
LDL Cholesterol: 122 mg/dL — ABNORMAL HIGH (ref 0–99)
Total CHOL/HDL Ratio: 4.3 ratio
Triglycerides: 178 mg/dL — ABNORMAL HIGH
VLDL: 36 mg/dL (ref 0–40)

## 2018-12-20 LAB — HEMOGLOBIN A1C
Hgb A1c MFr Bld: 5.1 % (ref 4.8–5.6)
Mean Plasma Glucose: 99.67 mg/dL

## 2018-12-20 LAB — HIV ANTIBODY (ROUTINE TESTING W REFLEX): HIV Screen 4th Generation wRfx: NONREACTIVE

## 2018-12-20 LAB — TROPONIN I (HIGH SENSITIVITY): Troponin I (High Sensitivity): 733 ng/L (ref <18)

## 2018-12-20 LAB — POCT ACTIVATED CLOTTING TIME: Activated Clotting Time: 417 seconds

## 2018-12-20 LAB — TSH: TSH: 1.242 u[IU]/mL (ref 0.350–4.500)

## 2018-12-20 LAB — HEPARIN LEVEL (UNFRACTIONATED): Heparin Unfractionated: <0.1 IU/mL — ABNORMAL LOW (ref 0.30–0.70)

## 2018-12-20 SURGERY — LEFT HEART CATH AND CORONARY ANGIOGRAPHY
Anesthesia: LOCAL

## 2018-12-20 MED ORDER — TIROFIBAN HCL IN NACL 5-0.9 MG/100ML-% IV SOLN
0.1500 ug/kg/min | INTRAVENOUS | Status: DC
  Filled 2018-12-20: qty 100

## 2018-12-20 MED ORDER — TICAGRELOR 90 MG PO TABS
ORAL_TABLET | ORAL | Status: DC | PRN
  Administered 2018-12-20: 180 mg via ORAL

## 2018-12-20 MED ORDER — METOPROLOL TARTRATE 5 MG/5ML IV SOLN
INTRAVENOUS | Status: AC
  Filled 2018-12-20: qty 5

## 2018-12-20 MED ORDER — HEPARIN BOLUS VIA INFUSION
2000.0000 [IU] | Freq: Once | INTRAVENOUS | Status: AC
  Administered 2018-12-20: 2000 [IU] via INTRAVENOUS
  Filled 2018-12-20: qty 2000

## 2018-12-20 MED ORDER — TICAGRELOR 90 MG PO TABS
ORAL_TABLET | ORAL | Status: AC
  Filled 2018-12-20: qty 1

## 2018-12-20 MED ORDER — HYDRALAZINE HCL 20 MG/ML IJ SOLN
INTRAMUSCULAR | Status: AC
  Filled 2018-12-20: qty 1

## 2018-12-20 MED ORDER — ALPRAZOLAM 0.25 MG PO TABS
ORAL_TABLET | ORAL | Status: AC
  Filled 2018-12-20: qty 1

## 2018-12-20 MED ORDER — HEPARIN (PORCINE) IN NACL 1000-0.9 UT/500ML-% IV SOLN
INTRAVENOUS | Status: AC
  Filled 2018-12-20: qty 1000

## 2018-12-20 MED ORDER — ONDANSETRON HCL 4 MG/2ML IJ SOLN: 4.0000 mg | Freq: Four times a day (QID) | INTRAMUSCULAR | Status: DC | PRN

## 2018-12-20 MED ORDER — ACETAMINOPHEN 325 MG PO TABS: 650.0000 mg | ORAL_TABLET | ORAL | Status: DC | PRN

## 2018-12-20 MED ORDER — BIVALIRUDIN TRIFLUOROACETATE 250 MG IV SOLR
INTRAVENOUS | Status: AC
  Filled 2018-12-20: qty 250

## 2018-12-20 MED ORDER — TICAGRELOR 90 MG PO TABS
90.0000 mg | ORAL_TABLET | Freq: Two times a day (BID) | ORAL | Status: DC
  Administered 2018-12-20 – 2018-12-21 (×2): 90 mg via ORAL
  Filled 2018-12-20 (×2): qty 1

## 2018-12-20 MED ORDER — VERAPAMIL HCL 2.5 MG/ML IV SOLN
INTRAVENOUS | Status: AC
  Filled 2018-12-20: qty 2

## 2018-12-20 MED ORDER — IOHEXOL 350 MG/ML SOLN
INTRAVENOUS | Status: DC | PRN
  Administered 2018-12-20: 11:00:00 105 mL via INTRACARDIAC

## 2018-12-20 MED ORDER — ASPIRIN 81 MG PO CHEW
81.0000 mg | CHEWABLE_TABLET | Freq: Every day | ORAL | Status: DC
  Administered 2018-12-21: 81 mg via ORAL
  Filled 2018-12-20: qty 1

## 2018-12-20 MED ORDER — MORPHINE SULFATE (PF) 2 MG/ML IV SOLN: 2.0000 mg | INTRAVENOUS | Status: DC | PRN

## 2018-12-20 MED ORDER — AMLODIPINE BESYLATE 5 MG PO TABS
5.0000 mg | ORAL_TABLET | Freq: Every day | ORAL | Status: DC
  Administered 2018-12-20 – 2018-12-21 (×2): 5 mg via ORAL
  Filled 2018-12-20 (×2): qty 1

## 2018-12-20 MED ORDER — HEPARIN SODIUM (PORCINE) 1000 UNIT/ML IJ SOLN
INTRAMUSCULAR | Status: AC
  Filled 2018-12-20: qty 1

## 2018-12-20 MED ORDER — ATORVASTATIN CALCIUM 80 MG PO TABS
80.0000 mg | ORAL_TABLET | Freq: Every day | ORAL | Status: DC
  Administered 2018-12-20: 80 mg via ORAL
  Filled 2018-12-20: qty 1

## 2018-12-20 MED ORDER — HEPARIN (PORCINE) IN NACL 1000-0.9 UT/500ML-% IV SOLN
INTRAVENOUS | Status: DC | PRN
  Administered 2018-12-20 (×3): 500 mL

## 2018-12-20 MED ORDER — LABETALOL HCL 5 MG/ML IV SOLN
10.0000 mg | INTRAVENOUS | Status: AC | PRN
  Administered 2018-12-20 (×2): 10 mg via INTRAVENOUS
  Filled 2018-12-20: qty 4

## 2018-12-20 MED ORDER — NITROGLYCERIN 1 MG/10 ML FOR IR/CATH LAB
INTRA_ARTERIAL | Status: AC
  Filled 2018-12-20: qty 10

## 2018-12-20 MED ORDER — SODIUM CHLORIDE 0.9 % IV SOLN: 250.0000 mL | INTRAVENOUS | Status: DC | PRN

## 2018-12-20 MED ORDER — SODIUM CHLORIDE 0.9 % IV SOLN
1.7500 mg/kg/h | INTRAVENOUS | Status: DC
  Administered 2018-12-20: 14:00:00 1.75 mg/kg/h via INTRAVENOUS
  Filled 2018-12-20 (×3): qty 250

## 2018-12-20 MED ORDER — SODIUM CHLORIDE 0.9 % IV SOLN
INTRAVENOUS | Status: AC | PRN
  Administered 2018-12-20: 11:00:00 1.75 mg/kg/h via INTRAVENOUS

## 2018-12-20 MED ORDER — BIVALIRUDIN BOLUS VIA INFUSION - CUPID
INTRAVENOUS | Status: DC | PRN
  Administered 2018-12-20: 11:00:00 61.95 mg via INTRAVENOUS

## 2018-12-20 MED ORDER — SODIUM CHLORIDE 0.9 % IV SOLN: INTRAVENOUS | Status: AC

## 2018-12-20 MED ORDER — MORPHINE SULFATE (PF) 10 MG/ML IV SOLN: 2.0000 mg | INTRAVENOUS | Status: DC | PRN

## 2018-12-20 MED ORDER — VERAPAMIL HCL 2.5 MG/ML IV SOLN: INTRA_ARTERIAL | Status: DC | PRN

## 2018-12-20 MED ORDER — SODIUM CHLORIDE 0.9% FLUSH: 3.0000 mL | INTRAVENOUS | Status: DC | PRN

## 2018-12-20 MED ORDER — HYDRALAZINE HCL 20 MG/ML IJ SOLN
INTRAMUSCULAR | Status: DC | PRN
  Administered 2018-12-20: 10 mg via INTRAVENOUS

## 2018-12-20 MED ORDER — SODIUM CHLORIDE 0.9% FLUSH
3.0000 mL | Freq: Two times a day (BID) | INTRAVENOUS | Status: DC
  Administered 2018-12-20 (×2): 3 mL via INTRAVENOUS

## 2018-12-20 MED ORDER — HYDRALAZINE HCL 20 MG/ML IJ SOLN
10.0000 mg | INTRAMUSCULAR | Status: AC | PRN
  Administered 2018-12-20 (×2): 10 mg via INTRAVENOUS
  Filled 2018-12-20: qty 1

## 2018-12-20 MED ORDER — LIDOCAINE HCL (PF) 1 % IJ SOLN
INTRAMUSCULAR | Status: AC
  Filled 2018-12-20: qty 30

## 2018-12-20 MED ORDER — LIDOCAINE HCL (PF) 1 % IJ SOLN
INTRAMUSCULAR | Status: DC | PRN
  Administered 2018-12-20: 2 mL
  Administered 2018-12-20: 15 mL

## 2018-12-20 SURGICAL SUPPLY — 17 items
BALLN SAPPHIRE ~~LOC~~ 2.75X12 (BALLOONS) ×1 IMPLANT
CATH INFINITI 5FR MULTPACK ANG (CATHETERS) ×1 IMPLANT
CATH VISTA GUIDE 6FR JR4 (CATHETERS) ×1 IMPLANT
COVER DOME SNAP 22 D (MISCELLANEOUS) ×1 IMPLANT
GLIDESHEATH SLEND A-KIT 6F 22G (SHEATH) ×1 IMPLANT
KIT ENCORE 26 ADVANTAGE (KITS) ×1 IMPLANT
KIT HEART LEFT (KITS) ×2 IMPLANT
PACK CARDIAC CATHETERIZATION (CUSTOM PROCEDURE TRAY) ×2 IMPLANT
SHEATH PINNACLE 5F 10CM (SHEATH) ×1 IMPLANT
SHEATH PINNACLE 6F 10CM (SHEATH) ×1 IMPLANT
STENT SYNERGY DES 2.5X16 (Permanent Stent) ×1 IMPLANT
SYR MEDRAD MARK 7 150ML (SYRINGE) ×2 IMPLANT
TRANSDUCER W/STOPCOCK (MISCELLANEOUS) ×2 IMPLANT
TUBING CIL FLEX 10 FLL-RA (TUBING) ×2 IMPLANT
WIRE ASAHI PROWATER 180CM (WIRE) ×1 IMPLANT
WIRE EMERALD 3MM-J .035X150CM (WIRE) ×1 IMPLANT
WIRE HI TORQ VERSACORE-J 145CM (WIRE) IMPLANT

## 2018-12-20 NOTE — Interval H&P Note (Signed)
Cath Lab Visit (complete for each Cath Lab visit)  Clinical Evaluation Leading to the Procedure:   ACS: Yes.    Non-ACS:    Anginal Classification: CCS III  Anti-ischemic medical therapy: No Therapy  Non-Invasive Test Results: No non-invasive testing performed  Prior CABG: No previous CABG      History and Physical Interval Note:  12/20/2018 9:57 AM  Isabel [name]  has presented today for surgery, with the diagnosis of non-stemi.  The various methods of treatment have been discussed with the patient and family. After consideration of risks, benefits and other options for treatment, the patient has consented to  Procedure(s): LEFT HEART CATH AND CORONARY ANGIOGRAPHY (N/A) as a surgical intervention.  The patient's history has been reviewed, patient examined, no change in status, stable for surgery.  I have reviewed the patient's chart and labs.  Questions were answered to the patient's satisfaction.     Quay Burow

## 2018-12-20 NOTE — H&P (View-Only) (Signed)
Progress Note  Patient Name: Isabel [name] Date of Encounter: 12/20/2018  Primary Cardiologist: New  Subjective   NO SOB   No CP    Inpatient Medications    Scheduled Meds: . amLODipine  2.5 mg Oral Daily  . aspirin EC  81 mg Oral Daily  . atorvastatin  80 mg Oral q1800  . metoprolol tartrate  25 mg Oral QID  . sodium chloride flush  3 mL Intravenous Q12H  . spironolactone  25 mg Oral BID   Continuous Infusions: . sodium chloride    . sodium chloride 1 mL/kg/hr (12/20/18 0500)  . heparin 1,200 Units/hr (12/20/18 0204)   PRN Meds: sodium chloride, acetaminophen, ALPRAZolam, hydrALAZINE, metoprolol tartrate, nitroGLYCERIN, ondansetron (ZOFRAN) IV, sodium chloride flush, zolpidem   Vital Signs    Vitals:   12/19/18 2346 12/20/18 0016 12/20/18 0200 12/20/18 0440  BP: (!) 179/87 (!) 173/93  (!) 165/85  Pulse:    69  Resp:    16  Temp:    98.3 F (36.8 C)  TempSrc:    Oral  SpO2:    98%  Weight:   82.6 kg   Height:        Intake/Output Summary (Last 24 hours) at 12/20/2018 0739 Last data filed at 12/20/2018 0602 Gross per 24 hour  Intake 580.6 ml  Output 325 ml  Net 255.6 ml   Last 3 Weights 12/20/2018 12/19/2018 12/19/2018  Weight (lbs) 182 lb 182 lb 185 lb  Weight (kg) 82.555 kg 82.555 kg 83.915 kg      Telemetry    SR - Personally Reviewed  ECG    NOne - Personally Reviewed  Physical Exam   GEN: No acute distress.   Neck: No JVD Cardiac: RRR, no murmurs, rubs, or gallops.  Respiratory: Clear to auscultation bilaterally. GI: Soft, nontender, non-distended  MS: No edema; No deformity. Neuro:  Nonfocal  Psych: Normal affect   Labs    High Sensitivity Troponin:   Recent Labs  Lab 12/17/18 2004 12/19/18 1520 12/19/18 1700 12/19/18 2354  TROPONINIHS 23* 776* 917* 733*      Cardiac EnzymesNo results for input(s): TROPONINI in the last 168 hours. No results for input(s): TROPIPOC in the last 168 hours.   Chemistry Recent Labs  Lab  12/17/18 2004 12/19/18 1520 12/19/18 2354  NA 140 140 139  K 3.1* 3.1* 3.8  CL 106 105 110  CO2 25 22 15*  GLUCOSE 115* 106* 89  BUN 8 12 13   CREATININE 0.83 0.96 0.77  CALCIUM 9.3 9.2 9.2  PROT  --   --  6.8  ALBUMIN  --   --  3.7  AST  --   --  20  ALT  --   --  12  ALKPHOS  --   --  62  BILITOT  --   --  0.5  GFRNONAA >60 >60 >60  GFRAA >60 >60 >60  ANIONGAP 9 13 14      Hematology Recent Labs  Lab 12/17/18 2004 12/19/18 1520 12/19/18 2354  WBC 11.0* 9.9 12.5*  RBC 4.42 4.33 4.32  HGB 14.2 14.3 14.0  HCT 43.2 41.8 41.2  MCV 97.7 96.5 95.4  MCH 32.1 33.0 32.4  MCHC 32.9 34.2 34.0  RDW 12.1 12.0 12.0  PLT 264 229 242    BNPNo results for input(s): BNP, PROBNP in the last 168 hours.   DDimer No results for input(s): DDIMER in the last 168 hours.   Radiology  Dg Chest 2 View  Result Date: 12/19/2018 CLINICAL DATA:  Chest pain for 2 days EXAM: CHEST - 2 VIEW COMPARISON:  12/17/2018 FINDINGS: Cardiac shadow is stable. The lungs are well aerated bilaterally. No focal infiltrate or effusion is seen. No bony abnormality is noted. No significant interval change from the prior exam is seen. IMPRESSION: No active cardiopulmonary disease. Electronically Signed   By: Mark  Lukens M.D.   On: 12/19/2018 16:03    Cardiac Studies   Pend  Patient Profile     42 y.o. female with hx of severe HTN   PResents with Chest pain/pressure   Assessment & Plan    1  Chest pressure  GOne   Given Risks for CAD  (severe long standing HTN and tobacco as well as FHx) and mild elev of troponin will plan for L heart cath today    F/U based on results  2   HTN   HTNsive urgency on admit   May explain symptoms and trop    Improving with Rx (metoprolol, amlodiopne and aldactone)      Echo pending to eval for secondary effects from HTN  3   Lipid  LDL 122 this AM   Trig 178    ON statin now    WOuld probably treat aggessively even if no CAD given HTN and Tob  4  Hypokalemia  IMproved  this am   FOllow on aldactone    5  TOb  COunselled on smoking cessation again    For questions or updates, please contact [facility] Please consult [email or web address] for contact info under        Signed, Devlin Brink, MD  12/20/2018, 7:39 AM    

## 2018-12-20 NOTE — Progress Notes (Signed)
Site area: right groin  Site Prior to Removal:  Level 0  Pressure Applied For 30 MINUTES    Minutes Beginning at 1720  Manual:   Yes.    Patient Status During Pull:  stable  Post Pull Groin Site:  Level 0  Post Pull Instructions Given:  Yes.    Post Pull Pulses Present:  Yes.    Dressing Applied:  Yes.    Comments:  Held pressure for 30 minutes Bedrest started at 1750.

## 2018-12-20 NOTE — Progress Notes (Signed)
ANTICOAGULATION CONSULT NOTE - Follow Up Consult  Pharmacy Consult for heparin Indication: chest pain/ACS  Labs: Recent Labs    12/17/18 2004 12/19/18 1520 12/19/18 1700 12/19/18 2354  HGB 14.2 14.3  --  14.0  HCT 43.2 41.8  --  41.2  PLT 264 229  --  242  LABPROT  --   --   --  13.3  INR  --   --   --  1.0  HEPARINUNFRC  --   --   --  <0.10*  CREATININE 0.83 0.96  --  0.77  TROPONINIHS 23* 776* 917* 733*    Assessment: 42yo female subtherapeutic on heparin with initial dosing for CP; no gtt issues or signs of bleeding per RN.  Goal of Therapy:  Heparin level 0.3-0.7 units/ml   Plan:  Will rebolus with heparin 2000 units and increase heparin gtt by 4 units/kg/hr to 1200 units/hr and check level in 6 hours.    Wynona Neat, PharmD, BCPS  12/20/2018,1:54 AM

## 2018-12-20 NOTE — Progress Notes (Signed)
Progress Note  Patient Name: Isabel [name] Date of Encounter: 12/20/2018  Primary Cardiologist: New  Subjective   NO SOB   No CP    Inpatient Medications    Scheduled Meds: . amLODipine  2.5 mg Oral Daily  . aspirin EC  81 mg Oral Daily  . atorvastatin  80 mg Oral q1800  . metoprolol tartrate  25 mg Oral QID  . sodium chloride flush  3 mL Intravenous Q12H  . spironolactone  25 mg Oral BID   Continuous Infusions: . sodium chloride    . sodium chloride 1 mL/kg/hr (12/20/18 0500)  . heparin 1,200 Units/hr (12/20/18 0204)   PRN Meds: sodium chloride, acetaminophen, ALPRAZolam, hydrALAZINE, metoprolol tartrate, nitroGLYCERIN, ondansetron (ZOFRAN) IV, sodium chloride flush, zolpidem   Vital Signs    Vitals:   12/19/18 2346 12/20/18 0016 12/20/18 0200 12/20/18 0440  BP: (!) 179/87 (!) 173/93  (!) 165/85  Pulse:    69  Resp:    16  Temp:    98.3 F (36.8 C)  TempSrc:    Oral  SpO2:    98%  Weight:   82.6 kg   Height:        Intake/Output Summary (Last 24 hours) at 12/20/2018 0739 Last data filed at 12/20/2018 0602 Gross per 24 hour  Intake 580.6 ml  Output 325 ml  Net 255.6 ml   Last 3 Weights 12/20/2018 12/19/2018 12/19/2018  Weight (lbs) 182 lb 182 lb 185 lb  Weight (kg) 82.555 kg 82.555 kg 83.915 kg      Telemetry    SR - Personally Reviewed  ECG    NOne - Personally Reviewed  Physical Exam   GEN: No acute distress.   Neck: No JVD Cardiac: RRR, no murmurs, rubs, or gallops.  Respiratory: Clear to auscultation bilaterally. GI: Soft, nontender, non-distended  MS: No edema; No deformity. Neuro:  Nonfocal  Psych: Normal affect   Labs    High Sensitivity Troponin:   Recent Labs  Lab 12/17/18 2004 12/19/18 1520 12/19/18 1700 12/19/18 2354  TROPONINIHS 23* 776* 917* 733*      Cardiac EnzymesNo results for input(s): TROPONINI in the last 168 hours. No results for input(s): TROPIPOC in the last 168 hours.   Chemistry Recent Labs  Lab  12/17/18 2004 12/19/18 1520 12/19/18 2354  NA 140 140 139  K 3.1* 3.1* 3.8  CL 106 105 110  CO2 25 22 15*  GLUCOSE 115* 106* 89  BUN 8 12 13   CREATININE 0.83 0.96 0.77  CALCIUM 9.3 9.2 9.2  PROT  --   --  6.8  ALBUMIN  --   --  3.7  AST  --   --  20  ALT  --   --  12  ALKPHOS  --   --  62  BILITOT  --   --  0.5  GFRNONAA >60 >60 >60  GFRAA >60 >60 >60  ANIONGAP 9 13 14      Hematology Recent Labs  Lab 12/17/18 2004 12/19/18 1520 12/19/18 2354  WBC 11.0* 9.9 12.5*  RBC 4.42 4.33 4.32  HGB 14.2 14.3 14.0  HCT 43.2 41.8 41.2  MCV 97.7 96.5 95.4  MCH 32.1 33.0 32.4  MCHC 32.9 34.2 34.0  RDW 12.1 12.0 12.0  PLT 264 229 242    BNPNo results for input(s): BNP, PROBNP in the last 168 hours.   DDimer No results for input(s): DDIMER in the last 168 hours.   Radiology  Dg Chest 2 View  Result Date: 12/19/2018 CLINICAL DATA:  Chest pain for 2 days EXAM: CHEST - 2 VIEW COMPARISON:  12/17/2018 FINDINGS: Cardiac shadow is stable. The lungs are well aerated bilaterally. No focal infiltrate or effusion is seen. No bony abnormality is noted. No significant interval change from the prior exam is seen. IMPRESSION: No active cardiopulmonary disease. Electronically Signed   By: Alcide CleverMark  Lukens M.D.   On: 12/19/2018 16:03    Cardiac Studies   Pend  Patient Profile     43 y.o. female with hx of severe HTN   PResents with Chest pain/pressure   Assessment & Plan    1  Chest pressure  GOne   Given Risks for CAD  (severe long standing HTN and tobacco as well as FHx) and mild elev of troponin will plan for L heart cath today    F/U based on results  2   HTN   HTNsive urgency on admit   May explain symptoms and trop    Improving with Rx (metoprolol, amlodiopne and aldactone)      Echo pending to eval for secondary effects from HTN  3   Lipid  LDL 122 this AM   Trig 178    ON statin now    WOuld probably treat aggessively even if no CAD given HTN and Tob  4  Hypokalemia  IMproved  this am   FOllow on aldactone    5  TOb  COunselled on smoking cessation again    For questions or updates, please contact [facility] Please consult [email or web address] for contact info under        Signed, Dietrich PatesPaula Madisin Hasan, MD  12/20/2018, 7:39 AM

## 2018-12-20 NOTE — Plan of Care (Signed)
°  Problem: Education: °Goal: Knowledge of General Education information will improve °Description: Including pain rating scale, medication(s)/side effects and non-pharmacologic comfort measures °Outcome: Progressing °  °Problem: Clinical Measurements: °Goal: Ability to maintain clinical measurements within normal limits will improve °Outcome: Progressing °  °Problem: Clinical Measurements: °Goal: Diagnostic test results will improve °Outcome: Progressing °  °Problem: Clinical Measurements: °Goal: Cardiovascular complication will be avoided °Outcome: Progressing °  °Problem: Pain Managment: °Goal: General experience of comfort will improve °Outcome: Progressing °  °

## 2018-12-20 NOTE — Progress Notes (Addendum)
Progress Note  Patient Name: Isabel [name] Date of Encounter: 12/20/2018  Primary Cardiologist: Dorris Carnes, MD   Subjective   She denies recurrent chest pain this morning. No complaints of SOB or palpitations. We discussed plans for LHC today. The patient understands that risks included but are not limited to stroke (1 in 1000), death (1 in 72), kidney failure [usually temporary] (1 in 500), bleeding (1 in 200), allergic reaction [possibly serious] (1 in 200).  The patient understands and agrees to proceed.   Inpatient Medications    Scheduled Meds: . amLODipine  5 mg Oral Daily  . aspirin EC  81 mg Oral Daily  . atorvastatin  80 mg Oral q1800  . metoprolol tartrate  25 mg Oral QID  . sodium chloride flush  3 mL Intravenous Q12H  . spironolactone  25 mg Oral BID   Continuous Infusions: . sodium chloride    . sodium chloride 1 mL/kg/hr (12/20/18 0500)  . heparin 1,200 Units/hr (12/20/18 0204)   PRN Meds: sodium chloride, acetaminophen, ALPRAZolam, hydrALAZINE, metoprolol tartrate, nitroGLYCERIN, ondansetron (ZOFRAN) IV, sodium chloride flush, zolpidem   Vital Signs    Vitals:   12/19/18 2346 12/20/18 0016 12/20/18 0200 12/20/18 0440  BP: (!) 179/87 (!) 173/93  (!) 165/85  Pulse:    69  Resp:    16  Temp:    98.3 F (36.8 C)  TempSrc:    Oral  SpO2:    98%  Weight:   82.6 kg   Height:        Intake/Output Summary (Last 24 hours) at 12/20/2018 0833 Last data filed at 12/20/2018 0602 Gross per 24 hour  Intake 580.6 ml  Output 325 ml  Net 255.6 ml   Filed Weights   12/19/18 1515 12/19/18 2227 12/20/18 0200  Weight: 83.9 kg 82.6 kg 82.6 kg    Telemetry    NSR - Personally Reviewed  Physical Exam   GEN: Sitting upright in bed in no acute distress.   Neck: No JVD, no carotid bruits Cardiac: RRR, no murmurs, rubs, or gallops.  Respiratory: Clear to auscultation bilaterally, no wheezes/ rales/ rhonchi GI: NABS, Soft, obese, nontender, non-distended  MS:  No edema; No deformity. Neuro:  Nonfocal, moving all extremities spontaneously Psych: Normal affect   Labs    Chemistry Recent Labs  Lab 12/17/18 2004 12/19/18 1520 12/19/18 2354  NA 140 140 139  K 3.1* 3.1* 3.8  CL 106 105 110  CO2 25 22 15*  GLUCOSE 115* 106* 89  BUN 8 12 13   CREATININE 0.83 0.96 0.77  CALCIUM 9.3 9.2 9.2  PROT  --   --  6.8  ALBUMIN  --   --  3.7  AST  --   --  20  ALT  --   --  12  ALKPHOS  --   --  62  BILITOT  --   --  0.5  GFRNONAA >60 >60 >60  GFRAA >60 >60 >60  ANIONGAP 9 13 14      Hematology Recent Labs  Lab 12/17/18 2004 12/19/18 1520 12/19/18 2354  WBC 11.0* 9.9 12.5*  RBC 4.42 4.33 4.32  HGB 14.2 14.3 14.0  HCT 43.2 41.8 41.2  MCV 97.7 96.5 95.4  MCH 32.1 33.0 32.4  MCHC 32.9 34.2 34.0  RDW 12.1 12.0 12.0  PLT 264 229 242    Cardiac EnzymesNo results for input(s): TROPONINI in the last 168 hours. No results for input(s): TROPIPOC in the last 168 hours.  BNPNo results for input(s): BNP, PROBNP in the last 168 hours.   DDimer No results for input(s): DDIMER in the last 168 hours.   Radiology    Dg Chest 2 View  Result Date: 12/19/2018 CLINICAL DATA:  Chest pain for 2 days EXAM: CHEST - 2 VIEW COMPARISON:  12/17/2018 FINDINGS: Cardiac shadow is stable. The lungs are well aerated bilaterally. No focal infiltrate or effusion is seen. No bony abnormality is noted. No significant interval change from the prior exam is seen. IMPRESSION: No active cardiopulmonary disease. Electronically Signed   By: Alcide CleverMark  Lukens M.D.   On: 12/19/2018 16:03    Cardiac Studies   LHC and Echo pending today  Patient Profile     43 y.o. female with a history of untreated hypertension and HLD with family history of premature coronary artery disease, who presented with chest pain.  Assessment & Plan    1. NSTEMI in patient with moderate risk factors for CAD: patient presented with new onset chest pain with mixed typical/atypical features. She was  seen in the ED 7/21 with negative trops. She returned 7/23 and trops peaked at 917>733. EKG non-ischemic. BP significantly elevated which likely contributed. She was started on a heparin gtt given risk factors of uncontrolled HTN, unmanaged HLD, and family history of premature CAD.  - Continue heparin gtt for now. - Plan for LHC today to evaluate coronary anatomy - Echo pending to assess LV function, chamber thickness - Continue aspirin and high intensity statin  2. HTN: BP poorly controlled, though overall improved this morning. She was started on amlodipine, metoprolol tartrate, and spironolactone.  - Will continue to monitor to determine need for additional titrations  3. HLD: LDL 122 on FLP this AM. Started on high intensity statin. - Continue atorvastatin  4. Tobacco abuse: continue to smoke 1ppd. She has quit in the past but restarted due to increase stress. Feels like she can quit again. Educated on importance of cessation and risk if she continues to smoke.  - Continue to encourage smoking cessation.    For questions or updates, please contact [facility] Please consult [email or web address] for contact info under Cardiology/STEMI.      Signed, Beatriz StallionKrista M. Kroeger, PA-C  12/20/2018, 8:33 AM   [phone]  Pt seen   I have note  Plan for L heart cath today  Dietrich PatesPaula Delma Drone MD

## 2018-12-21 DIAGNOSIS — I2510 Atherosclerotic heart disease of native coronary artery without angina pectoris: Secondary | ICD-10-CM

## 2018-12-21 DIAGNOSIS — E785 Hyperlipidemia, unspecified: Secondary | ICD-10-CM

## 2018-12-21 DIAGNOSIS — E876 Hypokalemia: Secondary | ICD-10-CM

## 2018-12-21 LAB — CBC
HCT: 37.9 % (ref 36.0–46.0)
Hemoglobin: 12.6 g/dL (ref 12.0–15.0)
MCH: 32 pg (ref 26.0–34.0)
MCHC: 33.2 g/dL (ref 30.0–36.0)
MCV: 96.2 fL (ref 80.0–100.0)
Platelets: 222 10*3/uL (ref 150–400)
RBC: 3.94 MIL/uL (ref 3.87–5.11)
RDW: 12.3 % (ref 11.5–15.5)
WBC: 10.8 10*3/uL — ABNORMAL HIGH (ref 4.0–10.5)
nRBC: 0 % (ref 0.0–0.2)

## 2018-12-21 LAB — BASIC METABOLIC PANEL
Anion gap: 8 (ref 5–15)
BUN: 17 mg/dL (ref 6–20)
CO2: 21 mmol/L — ABNORMAL LOW (ref 22–32)
Calcium: 9.4 mg/dL (ref 8.9–10.3)
Chloride: 112 mmol/L — ABNORMAL HIGH (ref 98–111)
Creatinine, Ser: 0.87 mg/dL (ref 0.44–1.00)
GFR calc Af Amer: >60 mL/min (ref >60)
GFR calc non Af Amer: >60 mL/min (ref >60)
Glucose, Bld: 91 mg/dL (ref 70–99)
Potassium: 3.2 mmol/L — ABNORMAL LOW (ref 3.5–5.1)
Sodium: 141 mmol/L (ref 135–145)

## 2018-12-21 MED ORDER — TICAGRELOR 90 MG PO TABS: 90.0000 mg | ORAL_TABLET | Freq: Two times a day (BID) | ORAL | 0 refills | Status: DC

## 2018-12-21 MED ORDER — SPIRONOLACTONE 25 MG PO TABS: 25.0000 mg | ORAL_TABLET | Freq: Two times a day (BID) | ORAL | 5 refills | Status: DC

## 2018-12-21 MED ORDER — METOPROLOL TARTRATE 50 MG PO TABS: 50.0000 mg | ORAL_TABLET | Freq: Two times a day (BID) | ORAL | 5 refills | Status: DC

## 2018-12-21 MED ORDER — AMLODIPINE BESYLATE 5 MG PO TABS: 5.0000 mg | ORAL_TABLET | Freq: Every day | ORAL | 3 refills | Status: DC

## 2018-12-21 MED ORDER — TICAGRELOR 90 MG PO TABS: 90.0000 mg | ORAL_TABLET | Freq: Two times a day (BID) | ORAL | 3 refills | Status: DC

## 2018-12-21 MED ORDER — ASPIRIN 81 MG PO CHEW: 81.0000 mg | CHEWABLE_TABLET | Freq: Every day | ORAL | Status: AC

## 2018-12-21 MED ORDER — NITROGLYCERIN 0.4 MG SL SUBL: 0.4000 mg | SUBLINGUAL_TABLET | SUBLINGUAL | 3 refills | Status: AC | PRN

## 2018-12-21 MED ORDER — ATORVASTATIN CALCIUM 80 MG PO TABS: 80.0000 mg | ORAL_TABLET | Freq: Every day | ORAL | 3 refills | Status: DC

## 2018-12-21 NOTE — Plan of Care (Signed)

## 2018-12-21 NOTE — Progress Notes (Addendum)
Progress Note  Patient Name: Isabel [name] Date of Encounter: 12/21/2018  Primary Cardiologist: Dietrich PatesPaula Brizeyda Holtmeyer, MD   Subjective   Denies any CP or SOB.   Inpatient Medications    Scheduled Meds: . amLODipine  5 mg Oral Daily  . aspirin  81 mg Oral Daily  . atorvastatin  80 mg Oral q1800  . metoprolol tartrate  25 mg Oral QID  . sodium chloride flush  3 mL Intravenous Q12H  . spironolactone  25 mg Oral BID  . ticagrelor  90 mg Oral BID   Continuous Infusions: . sodium chloride     PRN Meds: sodium chloride, acetaminophen, ALPRAZolam, metoprolol tartrate, morphine injection, nitroGLYCERIN, ondansetron (ZOFRAN) IV, sodium chloride flush, zolpidem   Vital Signs    Vitals:   12/20/18 2031 12/20/18 2116 12/21/18 0501 12/21/18 0532  BP: 130/68  (!) 149/74   Pulse: 76 72 70   Resp: 16  17   Temp: 98.6 F (37 C)  98.2 F (36.8 C)   TempSrc: Oral  Oral   SpO2: 98%  99%   Weight:    82.5 kg  Height:        Intake/Output Summary (Last 24 hours) at 12/21/2018 0715 Last data filed at 12/21/2018 0520 Gross per 24 hour  Intake 578.57 ml  Output 1350 ml  Net -771.43 ml   Last 3 Weights 12/21/2018 12/20/2018 12/19/2018  Weight (lbs) 181 lb 14.1 oz 182 lb 182 lb  Weight (kg) 82.5 kg 82.555 kg 82.555 kg      Telemetry    NSR without significant ventricular ectopy - Personally Reviewed  ECG    7/24 NSR without significant ST-T wave changes - Personally Reviewed  Physical Exam   GEN: No acute distress.   Neck: No JVD Cardiac: RRR, no murmurs, rubs, or gallops. R radial and R femoral cath site stable  Respiratory: Clear to auscultation bilaterally. GI: Soft, nontender, non-distended  MS: No edema; No deformity. Neuro:  Nonfocal  Psych: Normal affect   Labs    High Sensitivity Troponin:   Recent Labs  Lab 12/17/18 2004 12/19/18 1520 12/19/18 1700 12/19/18 2354  TROPONINIHS 23* 776* 917* 733*      Cardiac EnzymesNo results for input(s): TROPONINI in the last  168 hours. No results for input(s): TROPIPOC in the last 168 hours.   Chemistry Recent Labs  Lab 12/17/18 2004 12/19/18 1520 12/19/18 2354  NA 140 140 139  K 3.1* 3.1* 3.8  CL 106 105 110  CO2 25 22 15*  GLUCOSE 115* 106* 89  BUN 8 12 13   CREATININE 0.83 0.96 0.77  CALCIUM 9.3 9.2 9.2  PROT  --   --  6.8  ALBUMIN  --   --  3.7  AST  --   --  20  ALT  --   --  12  ALKPHOS  --   --  62  BILITOT  --   --  0.5  GFRNONAA >60 >60 >60  GFRAA >60 >60 >60  ANIONGAP 9 13 14      Hematology Recent Labs  Lab 12/17/18 2004 12/19/18 1520 12/19/18 2354  WBC 11.0* 9.9 12.5*  RBC 4.42 4.33 4.32  HGB 14.2 14.3 14.0  HCT 43.2 41.8 41.2  MCV 97.7 96.5 95.4  MCH 32.1 33.0 32.4  MCHC 32.9 34.2 34.0  RDW 12.1 12.0 12.0  PLT 264 229 242    BNPNo results for input(s): BNP, PROBNP in the last 168 hours.   DDimer No  results for input(s): DDIMER in the last 168 hours.   Radiology    Dg Chest 2 View  Result Date: 12/19/2018 CLINICAL DATA:  Chest pain for 2 days EXAM: CHEST - 2 VIEW COMPARISON:  12/17/2018 FINDINGS: Cardiac shadow is stable. The lungs are well aerated bilaterally. No focal infiltrate or effusion is seen. No bony abnormality is noted. No significant interval change from the prior exam is seen. IMPRESSION: No active cardiopulmonary disease. Electronically Signed   By: Inez Catalina M.D.   On: 12/19/2018 16:03    Cardiac Studies   Cath 12/20/2018  Prox RCA lesion is 80% stenosed.  A drug-eluting stent was successfully placed using a STENT SYNERGY DES 2.5X16.  Post intervention, there is a 0% residual stenosis.  The left ventricular systolic function is normal.  LV end diastolic pressure is normal.  The left ventricular ejection fraction is 55-65% by visual estimate.   Echo 12/20/2018 IMPRESSIONS    1. The left ventricle has normal systolic function with an ejection fraction of 60-65%. The cavity size was normal. There is moderate concentric left ventricular  hypertrophy. Left ventricular diastolic Doppler parameters are consistent with impaired  relaxation. Elevated left ventricular end-diastolic pressure.  2. The right ventricle has normal systolic function. The cavity was normal. There is no increase in right ventricular wall thickness.  3. Left atrial size was moderately dilated.  4. No stenosis of the aortic valve.  5. The aorta is normal in size and structure.  Patient Profile     43 y.o. female with PMH of HTN and FHx of early CAD presented with chest pain and ruled in for NSTEMI.   Assessment & Plan    1. NSTEMI: HS Trop peaked at 917.   - cath 12/20/2018 showed 80% prox RCA treated with 2.5x 31mm DES.  - Echo 7/24 EF 60-65%  - No further chest pain, emphasis placed on compliance with DAPT. Discharge today. TOC followup in 7 days. No lifting > 5 lbs for 1 week  2. HTN: BP continue to be borderline high, consolidate metoprolol 25mg  QID to 50mg  BID. Continue spironolactone and amlodipine. Titrate BP medication on followup  3. HLD: continue lipitor, FLP and LFT in 6-8 weeks  4. Hypokalemia: improved on spironolactone, 1 week BMET  5. Tobacco abuse: cessation emphasized.    For questions or updates, please contact [place] Please consult [email or web address] for contact info under        Signed, Almyra Deforest, PA  12/21/2018, 7:15 AM    Patient seen and examined  I agree with findings as noted above by Janan Ridge  She feels good  BP is still not optimal   Will titrate as outpt   Echo shows moderate LVH    Normal LVEF     On exam Lungs are CTA Cardiac RRR   Normal S1, S2    Abd is benign Ext are without edema  No hematoma  OK for d/c today   WIll follow as outpt  Dorris Carnes MD

## 2018-12-21 NOTE — Progress Notes (Signed)
CARDIAC REHAB PHASE I   PRE:  Rate/Rhythm: 75 SR  BP:  Sitting: 190/88      SaO2: 97 RA  MODE:  Ambulation: 400 ft   POST:  Rate/Rhythm: 95 SR  BP:  Sitting: 198/98    SaO2: 99 RA   Pt ambulated 458ft in hallway independently with steady gait. Pt denies CP or SOB. Pt educated on importance of ASA, Brilinta, statin, and NTG. Pt given heart healthy diet and MI Book. Reviewed restrictions, site care, and exercise guidelines. Pt counseled on smoking cessation, and is agreeable, tip sheet given. Talked with pt at length about stress and stress reduction. Encouraged pt to take time for herself. Will refer to CRP II [place] Rufina Falco, RN BSN 12/21/2018 8:51 AM

## 2018-12-21 NOTE — Discharge Summary (Signed)
Discharge Summary    Patient ID: Isabel [name] MRN: [id number]; DOB: Oct 13, 1975  Admit date: 12/19/2018 Discharge date: 12/21/2018  Primary Care Provider: Patient, No Pcp Per  Primary Cardiologist: Dietrich PatesPaula Ross, MD  Primary Electrophysiologist:  None   Discharge Diagnoses    Principal Problem:   NSTEMI (non-ST elevated myocardial infarction) [facility](HCC) Active Problems:   Hypertension   CAD (coronary artery disease)   Hyperlipidemia   Hypokalemia   Allergies No Known Allergies  Diagnostic Studies/Procedures    Echo 12/20/2018 IMPRESSIONS  1. The left ventricle has normal systolic function with an ejection fraction of 60-65%. The cavity size was normal. There is moderate concentric left ventricular hypertrophy. Left ventricular diastolic Doppler parameters are consistent with impaired  relaxation. Elevated left ventricular end-diastolic pressure.  2. The right ventricle has normal systolic function. The cavity was normal. There is no increase in right ventricular wall thickness.  3. Left atrial size was moderately dilated.  4. No stenosis of the aortic valve.  5. The aorta is normal in size and structure.   Cath 12/20/2018  Prox RCA lesion is 80% stenosed.  A drug-eluting stent was successfully placed using a STENT SYNERGY DES 2.5X16.  Post intervention, there is a 0% residual stenosis.  The left ventricular systolic function is normal.  LV end diastolic pressure is normal.  The left ventricular ejection fraction is 55-65% by visual estimate.    _____________   History of Present Illness     Isabel [name] has a history of HTN that is untreated   Diagnosed initaily during pregnancy   She says she was in her usual state of health until Tuesday, 2 days ago.  She then had sudden onset of pain all across her chest.  It was sharp and stabbing and went through to her back.  It was hard to get a deep breath.  She had some diaphoresis, but no nausea or vomiting.  The pain began  at about 2:30 in the afternoon.  When her symptoms did not resolve, she came to the emergency room about 7:30 PM.  In the emergency room, she had labs done, an EKG, and a chest x-ray.  She was not told that anything was abnormal and went home prior to being seen by the ER provider.  During the time that she was in the emergency room, she does not remember being given any medications.  However, she said the pain gradually eased off and by the time she left it was down to 1/10.  She had not been on blood pressure medication in over 4 years.  After the episode of chest pain, she kept having intermittent episodes of chest pain and was concerned about her blood pressure, which had been very high in the emergency room.  She went to see her PCP.  Her PCP saw her and reviewed her labs.  Her troponin was noted to be elevated and she was sent back to the emergency room.  Isabel. Philis [name] has been having 4-5 episodes of chest pain per day since she was in the ER on Tuesday.  She stated that her husband has not let her do anything so her activity level has been much lower than usual.  However, she will get multiple episodes of chest pain during the day.  They are not as severe as her initial episode on Tuesday, but are otherwise the same.  The pain starts to the left of her upper sternum.  It is sharp.  The original pain was  a 10/10.  The subsequent episodes have been a 1-2/10.  There is no change with deep inspiration or position.  The symptoms will resolve spontaneously.  They are not exertional or related to meals.  Of note, her troponin in the emergency room today was higher than the troponin in the emergency room 2 days ago.  She is being started on IV heparin.  Although she is currently asymptomatic, she is being started on IV nitroglycerin because her blood pressure is extremely high.   Hospital Course     Consultants: N/A   She was ruled in for NSTEMI.  She underwent cardiac catheterization on  12/20/2018 which showed 80% proximal RCA lesion treated with a 2.5 x 16 mm DES.  Echocardiogram obtained on the same day showed EF 60 to 65%.  She had hypokalemia and was placed on spironolactone.  Blood pressure was uncontrolled on initial arrival as well.  She has since been placed on amlodipine and metoprolol.  She was seen in the morning of 12/21/2018 at which time she was doing well without chest pain or shortness of breath.  Metoprolol 25 mg 4 times daily has been consolidated to 50 mg twice daily.  She will need 1 week basic metabolic panel to look at potassium level after addition of spironolactone.  She also needs 6 to 8 weeks fasting lipid panel and LFT.  Otherwise we plan to follow[facility] with the patient [facility]  Tobacco cessation has been emphasized during this admission.  She is also aware that she needs to be compliant with dual antiplatelet therapy to prevent any stent restenosis.  _____________  Discharge Vitals Blood pressure (!) 198/98, pulse 70, temperature 98.2 F (36.8 C), temperature source Oral, resp. rate 17, height 5\' 5"  (1.651 m), weight 82.5 kg, last menstrual period 12/06/2018, SpO2 99 %.  Filed Weights   12/19/18 2227 12/20/18 0200 12/21/18 0532  Weight: 82.6 kg 82.6 kg 82.5 kg    Labs & Radiologic Studies    CBC Recent Labs    12/19/18 2354 12/21/18 0757  WBC 12.5* 10.8*  HGB 14.0 12.6  HCT 41.2 37.9  MCV 95.4 96.2  PLT 242 222   Basic Metabolic Panel Recent Labs    16/02/9606/23/20 2354 12/21/18 0757  NA 139 141  K 3.8 3.2*  CL 110 112*  CO2 15* 21*  GLUCOSE 89 91  BUN 13 17  CREATININE 0.77 0.87  CALCIUM 9.2 9.4   Liver Function Tests Recent Labs    12/19/18 2354  AST 20  ALT 12  ALKPHOS 62  BILITOT 0.5  PROT 6.8  ALBUMIN 3.7   No results for input(s): LIPASE, AMYLASE in the last 72 hours. Cardiac Enzymes No results for input(s): CKTOTAL, CKMB, CKMBINDEX, TROPONINI in the last 72 hours. BNP Invalid input(s): POCBNP  D-Dimer No results for input(s): DDIMER in the last 72 hours. Hemoglobin A1C Recent Labs    12/19/18 2354  HGBA1C 5.1   Fasting Lipid Panel Recent Labs    12/19/18 2354  CHOL 206*  HDL 48  LDLCALC 122*  TRIG 178*  CHOLHDL 4.3   Thyroid Function Tests Recent Labs    12/19/18 2354  TSH 1.242   _____________  Dg Chest 2 View  Result Date: 12/19/2018 CLINICAL DATA:  Chest pain for 2 days EXAM: CHEST - 2 VIEW COMPARISON:  12/17/2018 FINDINGS: Cardiac shadow is stable. The lungs are well aerated bilaterally. No focal infiltrate or effusion is seen. No bony abnormality is noted.  No significant interval change from the prior exam is seen. IMPRESSION: No active cardiopulmonary disease. Electronically Signed   By: Alcide CleverMark  Lukens M.D.   On: 12/19/2018 16:03   Dg Chest 2 View  Result Date: 12/17/2018 CLINICAL DATA:  Chest pain EXAM: CHEST - 2 VIEW COMPARISON:  12/13/2010 FINDINGS: The heart size and mediastinal contours are within normal limits. Both lungs are clear. The visualized skeletal structures are unremarkable. IMPRESSION: No active cardiopulmonary disease. Electronically Signed   By: Alcide CleverMark  Lukens M.D.   On: 12/17/2018 20:21   Disposition   Pt is being discharged home today in good condition.  Follow-up Plans & Appointments    Follow-up Information     [facility]. Go on 01/23/2019.   Why: follow up appointment set for 9:30 - please call clinic if you need to cancel or change appointment- they will call you if appointment needs to be telephonic.  Contact information: [address] [place] [phone]       Pricilla Riffleoss, Paula V, MD Follow up.   Specialty: Cardiology Why: cardiology scheduler will contact you to arrange a followup, please give us a call if you do not hear from us in 3 business days.  Contact information: [address] [place] [address] [phone]          Discharge  Instructions    Amb Referral to Cardiac Rehabilitation   Complete by: As directed    Diagnosis:  Coronary Stents NSTEMI     After initial evaluation and assessments completed: Virtual Based Care may be provided alone or in conjunction with Phase 2 Cardiac Rehab based on patient barriers.: Yes   Diet - low sodium heart healthy   Complete by: As directed    Discharge instructions   Complete by: As directed    No driving for 24 hours. No lifting over 5 lbs for 1 week. No sexual activity for 1 week. Keep procedure site clean & dry. If you notice increased pain, swelling, bleeding or pus, call/return!  You may shower, but no soaking baths/hot tubs/pools for 1 week.   Increase activity slowly   Complete by: As directed       Discharge Medications   Allergies as of 12/21/2018   No Known Allergies     Medication List    STOP taking these medications   ibuprofen 200 MG tablet Commonly known as: ADVIL     TAKE these medications   amLODipine 5 MG tablet Commonly known as: NORVASC Take 1 tablet (5 mg total) by mouth daily. Start taking on: December 22, 2018   aspirin 81 MG chewable tablet Chew 1 tablet (81 mg total) by mouth daily. Start taking on: December 22, 2018   atorvastatin 80 MG tablet Commonly known as: LIPITOR Take 1 tablet (80 mg total) by mouth daily at 6 PM.   metoprolol tartrate 50 MG tablet Commonly known as: LOPRESSOR Take 1 tablet (50 mg total) by mouth 2 (two) times daily.   nitroGLYCERIN 0.4 MG SL tablet Commonly known as: NITROSTAT Place 1 tablet (0.4 mg total) under the tongue every 5 (five) minutes x 3 doses as needed for chest pain.   ONE-A-DAY WOMENS PO Take 1 tablet by mouth daily with breakfast.   spironolactone 25 MG tablet Commonly known as: ALDACTONE Take 1 tablet (25 mg total) by mouth 2 (two) times daily.   ticagrelor 90 MG Tabs tablet Commonly known as: BRILINTA Take 1 tablet (90 mg  total) by mouth 2 (two) times daily.        Acute coronary  syndrome (MI, NSTEMI, STEMI, etc) this admission?: Yes.     AHA/ACC Clinical Performance & Quality Measures: 1. Aspirin prescribed? - Yes 2. ADP Receptor Inhibitor (Plavix/Clopidogrel, Brilinta/Ticagrelor or Effient/Prasugrel) prescribed (includes medically managed patients)? - Yes 3. Beta Blocker prescribed? - Yes 4. High Intensity Statin (Lipitor 40-80mg  or Crestor 20-40mg ) prescribed? - Yes 5. EF assessed during THIS hospitalization? - Yes 6. For EF <40%, was ACEI/ARB prescribed? - Not Applicable (EF >/= 88%) 7. For EF <40%, Aldosterone Antagonist (Spironolactone or Eplerenone) prescribed? - Not Applicable (EF >/= 89%) 8. Cardiac Rehab Phase II ordered (Included Medically managed Patients)? - Yes     Outstanding Labs/Studies   BMET in 1 week FLP and LFT in 6-8 weeks  Duration of Discharge Encounter   Greater than 30 minutes including physician time.  Hilbert Corrigan, PA 12/21/2018, 10:31 AM

## 2018-12-23 MED FILL — Nitroglycerin IV Soln 100 MCG/ML in D5W: INTRA_ARTERIAL | Qty: 10 | Status: AC

## 2018-12-23 MED FILL — Heparin Sodium (Porcine) Inj 1000 Unit/ML: INTRAMUSCULAR | Qty: 10 | Status: AC

## 2018-12-23 MED FILL — Verapamil HCl IV Soln 2.5 MG/ML: INTRAVENOUS | Qty: 2 | Status: AC

## 2018-12-24 ENCOUNTER — Telehealth (HOSPITAL_COMMUNITY): Payer: Self-pay

## 2018-12-24 NOTE — Telephone Encounter (Signed)
Pt doesn't have insurance called and spoke with pt husband and told him about the virtual rehab we offer and he stated that he talk it over with pt when she wakes up and will call back if interested. Tedra [place]. Support Rep II

## 2018-12-27 ENCOUNTER — Ambulatory Visit (INDEPENDENT_AMBULATORY_CARE_PROVIDER_SITE_OTHER): Payer: Medicaid Other | Admitting: Cardiology

## 2018-12-27 ENCOUNTER — Encounter: Payer: Self-pay | Admitting: Cardiology

## 2018-12-27 ENCOUNTER — Other Ambulatory Visit: Payer: Self-pay

## 2018-12-27 VITALS — BP 160/98 | HR 74 | Ht 65.0 in | Wt 179.0 lb

## 2018-12-27 DIAGNOSIS — Z720 Tobacco use: Secondary | ICD-10-CM

## 2018-12-27 DIAGNOSIS — I10 Essential (primary) hypertension: Secondary | ICD-10-CM | POA: Diagnosis not present

## 2018-12-27 DIAGNOSIS — Z955 Presence of coronary angioplasty implant and graft: Secondary | ICD-10-CM

## 2018-12-27 DIAGNOSIS — E785 Hyperlipidemia, unspecified: Secondary | ICD-10-CM

## 2018-12-27 DIAGNOSIS — I2510 Atherosclerotic heart disease of native coronary artery without angina pectoris: Secondary | ICD-10-CM | POA: Diagnosis not present

## 2018-12-27 MED ORDER — AMLODIPINE BESYLATE 10 MG PO TABS: 10.0000 mg | ORAL_TABLET | Freq: Every day | ORAL | 3 refills | Status: DC

## 2018-12-27 NOTE — Progress Notes (Signed)
Cardiology Office Note:    Date:  12/27/2018   ID:  OREOLUWA GILMER, DOB [birth year], MRN [id number]  PCP:  Patient, No Pcp Per  Cardiologist:  Dorris Carnes, MD  Referring MD: No ref. provider found   Chief Complaint  Patient presents with  . Hospitalization Follow-up    Post MI and PCI    History of Present Illness:    Isabel [name] is a 43 y.o. female with a past medical history significant for hypertension untreated, diagnosed initially during pregnancy and smoking.   She was admitted to the hospital 12/17/2018 with sudden onset of chest pain.  She initially left the ER prior to being seen.  Her PCP saw her and reviewed her labs noting an elevated troponin and sent her to the emergency department.  She was admitted on 12/19/2018 for NSTEMI.  Her peak high-sensitivity troponin was 917.  She underwent cardiac catheterization on 12/20/2018 which showed 80% proximal RCA lesion treated with a drug-eluting stent.  Echocardiogram on the same day showed EF 60-65% she had hypokalemia and was placed on spironolactone.  Blood pressure was uncontrolled initially.  She was also placed on amlodipine and metoprolol with improvement in blood pressure.  She was discharged home on dual antiplatelet therapy with aspirin 81 mg and ticagrelor 90 mg twice daily.  She was also started on high intensity statin with atorvastatin 80 mg daily.  Ms. Isabel [name] is there today for hospital follow-up. She is doing well. She does have some stress. She has 4 sons and stays home with them. The kids are in school virtually. 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 She helps care for her father in law that has had a stroke.   She has had some mild stabbing chest discomfort lasting a few minutes, not with activity and not like with her MI. She is having no shortness of breath, palpitations, orthopnea, PND or edema. Her right groin is still bruised but no hematoma, healing well.   SHe is working on her diet,  keeping a food log.   Home BPs 164-180/90's-100  She has cut way down on smoking, but still smokes about 4 cigarettes per day.  Past Medical History:  Diagnosis Date  . Hypertension   . NSTEMI (non-ST elevated myocardial infarction) ([facility]) 12/19/2018    Past Surgical History:  Procedure Laterality Date  . CESAREAN SECTION    . CHOLECYSTECTOMY    . CORONARY STENT INTERVENTION N/A 12/20/2018   Procedure: CORONARY STENT INTERVENTION;  Surgeon: Lorretta Harp, MD;  Location: [facility];  Service: Cardiovascular;  Laterality: N/A;  . LEFT HEART CATH AND CORONARY ANGIOGRAPHY N/A 12/20/2018   Procedure: LEFT HEART CATH AND CORONARY ANGIOGRAPHY;  Surgeon: Lorretta Harp, MD;  Location: [facility];  Service: Cardiovascular;  Laterality: N/A;    Current Medications: Current Meds  Medication Sig  . aspirin 81 MG chewable tablet Chew 1 tablet (81 mg total) by mouth daily.  Marland Kitchen atorvastatin (LIPITOR) 80 MG tablet Take 1 tablet (80 mg total) by mouth daily at 6 PM.  . metoprolol tartrate (LOPRESSOR) 50 MG tablet Take 1 tablet (50 mg total) by mouth 2 (two) times daily.  . Multiple Vitamins-Calcium (ONE-A-DAY WOMENS PO) Take 1 tablet by mouth daily with breakfast.  . nitroGLYCERIN (NITROSTAT) 0.4 MG SL tablet Place 1 tablet (0.4 mg total) under the tongue every 5 (five) minutes x 3 doses as needed for chest pain.  Marland Kitchen spironolactone (ALDACTONE) 25 MG  tablet Take 1 tablet (25 mg total) by mouth 2 (two) times daily.  . ticagrelor (BRILINTA) 90 MG TABS tablet Take 1 tablet (90 mg total) by mouth 2 (two) times daily.  . [DISCONTINUED] amLODipine (NORVASC) 5 MG tablet Take 1 tablet (5 mg total) by mouth daily.     Allergies:   Patient has no known allergies.   Social History   Socioeconomic History  . Marital status: Married    Spouse name: Not on file  . Number of children: Not on file  . Years of education: Not on file  . Highest education level: Not on file  Occupational  History  . 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 Financial resource strain: Not on file  . Food insecurity    Worry: Not on file    Inability: Not on file  . Transportation needs    Medical: Not on file    Non-medical: Not on file  Tobacco Use  . Smoking status: Light Tobacco Smoker    Packs/day: 1.00    Types: Cigarettes  . Smokeless tobacco: Never Used  Substance and Sexual Activity  . Alcohol use: Never    Frequency: Never  . Drug use: Never  . Sexual activity: Not on file  Lifestyle  . Physical activity    Days per week: Not on file    Minutes per session: Not on file  . Stress: Not on file  Relationships  . Social Herbalist on phone: Not on file    Gets together: Not on file    Attends religious service: Not on file    Active member of club or organization: Not on file    Attends meetings of clubs or organizations: Not on file    Relationship status: Not on file  Other Topics Concern  . Not on file  Social History Narrative   Lives with husband and 4 sons     Family History: The patient's family history includes Breast cancer in her paternal grandmother; Cervical cancer in her paternal grandmother; Diabetes in her maternal grandmother and paternal grandmother; Heart attack (age of onset: 66) in her maternal grandmother; Heart attack (age of onset: 75) in her mother; Heart attack (age of onset: 17) in her paternal grandfather; Hypertension in her father, mother, paternal grandfather, paternal grandmother, and sister. ROS:   Please see the history of present illness.     All other systems reviewed and are negative.  EKGs/Labs/Other Studies Reviewed:    The following studies were reviewed today:  Echo 12/20/2018 IMPRESSIONS 1. The left ventricle has normal systolic function with an ejection fraction of 60-65%. The cavity size was normal. There is moderate concentric left ventricular hypertrophy. Left ventricular diastolic Doppler  parameters are consistent with impaired  relaxation. Elevated left ventricular end-diastolic pressure. 2. The right ventricle has normal systolic function. The cavity was normal. There is no increase in right ventricular wall thickness. 3. Left atrial size was moderately dilated. 4. No stenosis of the aortic valve. 5. The aorta is normal in size and structure.  Cath 12/20/2018  Prox RCA lesion is 80% stenosed.  A drug-eluting stent was successfully placed using a STENT SYNERGY DES 2.5X16.  Post intervention, there is a 0% residual stenosis.  The left ventricular systolic function is normal.  LV end diastolic pressure is normal.  The left ventricular ejection fraction is 55-65% by visual estimate.   EKG:  EKG is not ordered today.   Recent  Labs: 12/19/2018: ALT 12; TSH 1.242 12/21/2018: BUN 17; Creatinine, Ser 0.87; Hemoglobin 12.6; Platelets 222; Potassium 3.2; Sodium 141   Recent Lipid Panel    Component Value Date/Time   CHOL 206 (H) 12/19/2018 2354   TRIG 178 (H) 12/19/2018 2354   HDL 48 12/19/2018 2354   CHOLHDL 4.3 12/19/2018 2354   VLDL 36 12/19/2018 2354   LDLCALC 122 (H) 12/19/2018 2354    Physical Exam:    VS:  BP (!) 160/98   Pulse 74   Ht '5\' 5"'  (1.651 m)   Wt 179 lb (81.2 kg)   LMP 12/06/2018 Comment: tubal ligation  SpO2 95%   BMI 29.79 kg/m     Wt Readings from Last 3 Encounters:  12/27/18 179 lb (81.2 kg)  12/21/18 181 lb 14.1 oz (82.5 kg)     Physical Exam  Constitutional: She is oriented to person, place, and time. She appears well-developed and well-nourished. No distress.  HENT:  Head: Normocephalic and atraumatic.  Neck: Normal range of motion. Neck supple. No JVD present.  Cardiovascular: Normal rate, regular rhythm, normal heart sounds and intact distal pulses. Exam reveals no gallop and no friction rub.  No murmur heard. Pulmonary/Chest: Effort normal and breath sounds normal. No respiratory distress. She has no wheezes. She has  no rales.  Abdominal: Soft. Bowel sounds are normal.  Musculoskeletal: Normal range of motion.        General: No deformity or edema.  Neurological: She is alert and oriented to person, place, and time.  Skin: Skin is warm and dry.  Psychiatric: She has a normal mood and affect. Her behavior is normal. Judgment and thought content normal.  Vitals reviewed.    ASSESSMENT:    1. Coronary artery disease involving native coronary artery of native heart without angina pectoris   2. S/P coronary artery stent placement   3. Essential (primary) hypertension   4. Hyperlipidemia, unspecified hyperlipidemia type   5. Tobacco abuse    PLAN:    In order of problems listed above:  CAD status post NSTEMI and PCI with DES to RCA 12/20/2018.  Patient has been started on Brilinta, aspirin, high intensity statin. -Still has brief mild chest discomfort but not like with her MI.  -She is working on getting more active.  -Advised on secondary prevention with heart healthy diet, exercise, good blood pressure control, smoking cessation, weight loss, stress management and lipid management.  Hypertension.  Was not being treated for the past 4 years.  Patient has been started on amlodipine 5 mg, metoprolol tartrate 50 mg twice daily and Spironolactone considering she was hypokalemic in the hospital. -We will check again met today for renal function and potassium. -BP still elevated per home log and here. Will increase amlodipine and have her follow up in the HTN clinic in 2 weeks. I feel like she may need addition of an ARB.   Hypokalemia.  Potassium was 3.2.  Spironolactone was added to hypertensive regimen.  Will check labs today.  Hyperlipidemia.  Patient was started on high intensity statin with atorvastatin 80 mg daily in the hospital. LDL was 122.  We will follow-up lipid panel and LFTs in 6 weeks.  Tobacco abuse: She is down from 1 PPD to 4 cigarettes per day. Strongly advised on complete cessation.  She feels like she can accomplish cessation soon.    Medication Adjustments/Labs and Tests Ordered: Current medicines are reviewed at length with the patient today.  Concerns regarding medicines are outlined above.  Labs and tests ordered and medication changes are outlined in the patient instructions below:  Patient Instructions  Medication Instructions:  INCREASE: Amlodipine to 10 mg once a day   If you need a refill on your cardiac medications before your next appointment, please call your pharmacy.   Lab work: TODAY: BMET   FUTURE: Your physician recommends that you return for a FASTING lipid profile and hepatic function test on 01/23/19  If you have labs (blood work) drawn today and your tests are completely normal, you will receive your results only by: Marland Kitchen MyChart Message (if you have MyChart) OR . A paper copy in the mail If you have any lab test that is abnormal or we need to change your treatment, we will call you to review the results.  Testing/Procedures: None   Follow-Up: At [facility], you and your health needs are our priority.  As part of our continuing mission to provide you with exceptional heart care, we have created designated Provider Care Teams.  These Care Teams include your primary Cardiologist (physician) and Advanced Practice Providers (APPs -  Physician Assistants and Nurse Practitioners) who all work together to provide you with the care you need, when you need it. You will need a follow up appointment in:  6 months.  Please call our office 2 months in advance to schedule this appointment.  You may see Dorris Carnes, MD or one of the following Advanced Practice Providers on your designated Care Team: Richardson Dopp, PA-C [place], [place] . Daune Perch, NP  Follow up with our hypertension clinic in 2 weeks. Make sure you bring your log of blood pressure readings with you to your appointment  Any Other Special Instructions Will Be Listed Below (If Applicable).   Lifestyle Modifications to Prevent and Treat Heart Disease -Recommend heart healthy/Mediterranean diet, with whole grains, fruits, vegetable, fish, lean meats, nuts, and olive oil.  -Limit salt. -Recommend moderate walking, 3-5 times/week for 30-50 minutes each session. Aim for at least 150 minutes.week. Goal should be pace of 3 miles/hours, or walking 1.5 miles in 30 minutes -Recommend avoidance of tobacco products. Avoid excess alcohol. -Keep blood pressure well controlled, ideally less than 130/80.     Mediterranean Diet A Mediterranean diet refers to food and lifestyle choices that are based on the traditions of countries located on the [employer]. This way of eating has been shown to help prevent certain conditions and improve outcomes for people who have chronic diseases, like kidney disease and heart disease. What are tips for following this plan? Lifestyle  Cook and eat meals together with your family, when possible.  Drink enough fluid to keep your urine clear or pale yellow.  Be physically active every day. This includes: ? Aerobic exercise like running or swimming. ? Leisure activities like gardening, walking, or housework.  Get 7-8 hours of sleep each night.  If recommended by your health care provider, drink red wine in moderation. This means 1 glass a day for nonpregnant women and 2 glasses a day for men. A glass of wine equals 5 oz (150 mL). Reading food labels   Check the serving size of packaged foods. For foods such as rice and pasta, the serving size refers to the amount of cooked product, not dry.  Check the total fat in packaged foods. Avoid foods that have saturated fat or trans fats.  Check the ingredients list for added sugars, such as corn syrup. Shopping  At the grocery store, buy most of  your food from the areas near the walls of the store. This includes: ? Fresh fruits and vegetables (produce). ? Grains, beans, nuts, and seeds. Some of these  may be available in unpackaged forms or large amounts (in bulk). ? Fresh seafood. ? Poultry and eggs. ? Low-fat dairy products.  Buy whole ingredients instead of prepackaged foods.  Buy fresh fruits and vegetables in-season from local farmers markets.  Buy frozen fruits and vegetables in resealable bags.  If you do not have access to quality fresh seafood, buy precooked frozen shrimp or canned fish, such as tuna, salmon, or sardines.  Buy small amounts of raw or cooked vegetables, salads, or olives from the deli or salad bar at your store.  Stock your pantry so you always have certain foods on hand, such as olive oil, canned tuna, canned tomatoes, rice, pasta, and beans. Cooking  Cook foods with extra-virgin olive oil instead of using butter or other vegetable oils.  Have meat as a side dish, and have vegetables or grains as your main dish. This means having meat in small portions or adding small amounts of meat to foods like pasta or stew.  Use beans or vegetables instead of meat in common dishes like chili or lasagna.  Experiment with different cooking methods. Try roasting or broiling vegetables instead of steaming or sauteing them.  Add frozen vegetables to soups, stews, pasta, or rice.  Add nuts or seeds for added healthy fat at each meal. You can add these to yogurt, salads, or vegetable dishes.  Marinate fish or vegetables using olive oil, lemon juice, garlic, and fresh herbs. Meal planning   Plan to eat 1 vegetarian meal one day each week. Try to work up to 2 vegetarian meals, if possible.  Eat seafood 2 or more times a week.  Have healthy snacks readily available, such as: ? Vegetable sticks with hummus. ? [place] yogurt. ? Fruit and nut trail mix.  Eat balanced meals throughout the week. This includes: ? Fruit: 2-3 servings a day ? Vegetables: 4-5 servings a day ? Low-fat dairy: 2 servings a day ? Fish, poultry, or lean meat: 1 serving a day ? Beans and  legumes: 2 or more servings a week ? Nuts and seeds: 1-2 servings a day ? Whole grains: 6-8 servings a day ? Extra-virgin olive oil: 3-4 servings a day  Limit red meat and sweets to only a few servings a month What are my food choices?  Mediterranean diet ? Recommended  Grains: Whole-grain pasta. Brown rice. Bulgar wheat. Polenta. Couscous. Whole-wheat bread. [place].  Vegetables: Artichokes. Beets. Broccoli. Cabbage. Carrots. Eggplant. Green beans. Chard. Kale. Spinach. Onions. Leeks. Peas. Squash. Tomatoes. Peppers. Radishes.  Fruits: Apples. Apricots. Avocado. Berries. Bananas. Cherries. Dates. Figs. Grapes. Lemons. Melon. Oranges. Peaches. Plums. Pomegranate.  Meats and other protein foods: Beans. Almonds. Sunflower seeds. Pine nuts. Peanuts. [place]. Salmon. Scallops. Shrimp. [place]. Tilapia. Clams. Oysters. Eggs.  Dairy: Low-fat milk. Cheese. Greek yogurt.  Beverages: Water. Red wine. Herbal tea.  Fats and oils: Extra virgin olive oil. Avocado oil. Grape seed oil.  Sweets and desserts: [place] yogurt with honey. Baked apples. Poached pears. Trail mix.  Seasoning and other foods: Basil. Cilantro. Coriander. Cumin. Mint. Parsley. Sage. Rosemary. Tarragon. Garlic. Oregano. Thyme. Pepper. Balsalmic vinegar. Tahini. Hummus. Tomato sauce. Olives. Mushrooms. ? Limit these  Grains: Prepackaged pasta or rice dishes. Prepackaged cereal with added sugar.  Vegetables: Deep fried potatoes (french fries).  Fruits: Fruit canned in syrup.  Meats and other protein  foods: Beef. Pork. Lamb. Poultry with skin. Hot dogs. Berniece Salines.  Dairy: Ice cream. Sour cream. Whole milk.  Beverages: Juice. Sugar-sweetened soft drinks. Beer. Liquor and spirits.  Fats and oils: Butter. Canola oil. Vegetable oil. Beef fat (tallow). Lard.  Sweets and desserts: Cookies. Cakes. Pies. Candy.  Seasoning and other foods: Mayonnaise. Premade sauces and marinades. The items listed may not be a complete list. Talk  with your dietitian about what dietary choices are right for you. Summary  The Mediterranean diet includes both food and lifestyle choices.  Eat a variety of fresh fruits and vegetables, beans, nuts, seeds, and whole grains.  Limit the amount of red meat and sweets that you eat.  Talk with your health care provider about whether it is safe for you to drink red wine in moderation. This means 1 glass a day for nonpregnant women and 2 glasses a day for men. A glass of wine equals 5 oz (150 mL). This information is not intended to replace advice given to you by your health care provider. Make sure you discuss any questions you have with your health care provider. Document Released: 01/06/2016 Document Revised: 01/13/2016 Document Reviewed: 01/06/2016 Elsevier Patient Education  2020 [place] with Quitting Smoking  Quitting smoking is a physical and mental challenge. You will face cravings, withdrawal symptoms, and temptation. Before quitting, work with your health care provider to make a plan that can help you cope. Preparation can help you quit and keep you from giving in. How can I cope with cravings? Cravings usually last for 5-10 minutes. If you get through it, the craving will pass. Consider taking the following actions to help you cope with cravings:  Keep your mouth busy: ? Chew sugar-free gum. ? Suck on hard candies or a straw. ? Brush your teeth.  Keep your hands and body busy: ? Immediately change to a different activity when you feel a craving. ? Squeeze or play with a ball. ? Do an activity or a hobby, like making bead jewelry, practicing needlepoint, or working with wood. ? Mix up your normal routine. ? Take a short exercise break. Go for a quick walk or run up and down stairs. ? Spend time in public places where smoking is not allowed.  Focus on doing something kind or helpful for someone else.  Call a friend or family member to talk during a craving.   Join a support group.  Call a quit line, such as 1-800-QUIT-NOW.  Talk with your health care provider about medicines that might help you cope with cravings and make quitting easier for you. How can I deal with withdrawal symptoms? Your body may experience negative effects as it tries to get used to not having nicotine in the system. These effects are called withdrawal symptoms. They may include:  Feeling hungrier than normal.  Trouble concentrating.  Irritability.  Trouble sleeping.  Feeling depressed.  Restlessness and agitation.  Craving a cigarette. To manage withdrawal symptoms:  Avoid places, people, and activities that trigger your cravings.  Remember why you want to quit.  Get plenty of sleep.  Avoid coffee and other caffeinated drinks. These may worsen some of your symptoms. How can I handle social situations? Social situations can be difficult when you are quitting smoking, especially in the first few weeks. To manage this, you can:  Avoid parties, bars, and other social situations where people might be smoking.  Avoid alcohol.  Leave right away if you have the  urge to smoke.  Explain to your family and friends that you are quitting smoking. Ask for understanding and support.  Plan activities with friends or family where smoking is not an option. What are some ways I can cope with stress? Wanting to smoke may cause stress, and stress can make you want to smoke. Find ways to manage your stress. Relaxation techniques can help. For example:  Breathe slowly and deeply, in through your nose and out through your mouth.  Listen to soothing, relaxing music.  Talk with a family member or friend about your stress.  Light a candle.  Soak in a bath or take a shower.  Think about a peaceful place. What are some ways I can prevent weight gain? Be aware that many people gain weight after they quit smoking. However, not everyone does. To keep from gaining weight,  have a plan in place before you quit and stick to the plan after you quit. Your plan should include:  Having healthy snacks. When you have a craving, it may help to: ? Eat plain popcorn, crunchy carrots, celery, or other cut vegetables. ? Chew sugar-free gum.  Changing how you eat: ? Eat small portion sizes at meals. ? Eat 4-6 small meals throughout the day instead of 1-2 large meals a day. ? Be mindful when you eat. Do not watch television or do other things that might distract you as you eat.  Exercising regularly: ? Make time to exercise each day. If you do not have time for a long workout, do short bouts of exercise for 5-10 minutes several times a day. ? Do some form of strengthening exercise, like weight lifting, and some form of aerobic exercise, like running or swimming.  Drinking plenty of water or other low-calorie or no-calorie drinks. Drink 6-8 glasses of water daily, or as much as instructed by your health care provider. Summary  Quitting smoking is a physical and mental challenge. You will face cravings, withdrawal symptoms, and temptation to smoke again. Preparation can help you as you go through these challenges.  You can cope with cravings by keeping your mouth busy (such as by chewing gum), keeping your body and hands busy, and making calls to family, friends, or a helpline for people who want to quit smoking.  You can cope with withdrawal symptoms by avoiding places where people smoke, avoiding drinks with caffeine, and getting plenty of rest.  Ask your health care provider about the different ways to prevent weight gain, avoid stress, and handle social situations. This information is not intended to replace advice given to you by your health care provider. Make sure you discuss any questions you have with your health care provider. Document Released: 05/12/2016 Document Revised: 04/27/2017 Document Reviewed: 05/12/2016 Elsevier Patient Education  2020 [place], Daune Perch, NP  12/27/2018 2:36 PM    [place]

## 2018-12-27 NOTE — Patient Instructions (Addendum)
Medication Instructions:  INCREASE: Amlodipine to 10 mg once a day   If you need a refill on your cardiac medications before your next appointment, please call your pharmacy.   Lab work: TODAY: BMET   FUTURE: Your physician recommends that you return for a FASTING lipid profile and hepatic function test on 01/23/19  If you have labs (blood work) drawn today and your tests are completely normal, you will receive your results only by:  [place] (if you have MyChart) OR  A paper copy in the mail If you have any lab test that is abnormal or we need to change your treatment, we will call you to review the results.  Testing/Procedures: None   Follow-Up: At [facility], you and your health needs are our priority.  As part of our continuing mission to provide you with exceptional heart care, we have created designated Provider Care Teams.  These Care Teams include your primary Cardiologist (physician) and Advanced Practice Providers (APPs -  Physician Assistants and Nurse Practitioners) who all work together to provide you with the care you need, when you need it. You will need a follow up appointment in:  6 months.  Please call our office 2 months in advance to schedule this appointment.  You may see Dorris Carnes, MD or one of the following Advanced Practice Providers on your designated Care Team: Richardson Dopp, PA-C [place], [place]  Daune Perch, NP  Follow up with our hypertension clinic in 2 weeks. Make sure you bring your log of blood pressure readings with you to your appointment  Any Other Special Instructions Will Be Listed Below (If Applicable).  Lifestyle Modifications to Prevent and Treat Heart Disease -Recommend heart healthy/Mediterranean diet, with whole grains, fruits, vegetable, fish, lean meats, nuts, and olive oil.  -Limit salt. -Recommend moderate walking, 3-5 times/week for 30-50 minutes each session. Aim for at least 150 minutes.week. Goal should be pace of 3  miles/hours, or walking 1.5 miles in 30 minutes -Recommend avoidance of tobacco products. Avoid excess alcohol. -Keep blood pressure well controlled, ideally less than 130/80.     Mediterranean Diet A Mediterranean diet refers to food and lifestyle choices that are based on the traditions of countries located on the [employer]. This way of eating has been shown to help prevent certain conditions and improve outcomes for people who have chronic diseases, like kidney disease and heart disease. What are tips for following this plan? Lifestyle  Cook and eat meals together with your family, when possible.  Drink enough fluid to keep your urine clear or pale yellow.  Be physically active every day. This includes: ? Aerobic exercise like running or swimming. ? Leisure activities like gardening, walking, or housework.  Get 7-8 hours of sleep each night.  If recommended by your health care provider, drink red wine in moderation. This means 1 glass a day for nonpregnant women and 2 glasses a day for men. A glass of wine equals 5 oz (150 mL). Reading food labels   Check the serving size of packaged foods. For foods such as rice and pasta, the serving size refers to the amount of cooked product, not dry.  Check the total fat in packaged foods. Avoid foods that have saturated fat or trans fats.  Check the ingredients list for added sugars, such as corn syrup. Shopping  At the grocery store, buy most of your food from the areas near the walls of the store. This includes: ? Fresh fruits and vegetables (  produce). ? Grains, beans, nuts, and seeds. Some of these may be available in unpackaged forms or large amounts (in bulk). ? Fresh seafood. ? Poultry and eggs. ? Low-fat dairy products.  Buy whole ingredients instead of prepackaged foods.  Buy fresh fruits and vegetables in-season from local farmers markets.  Buy frozen fruits and vegetables in resealable bags.  If you do not have  access to quality fresh seafood, buy precooked frozen shrimp or canned fish, such as tuna, salmon, or sardines.  Buy small amounts of raw or cooked vegetables, salads, or olives from the deli or salad bar at your store.  Stock your pantry so you always have certain foods on hand, such as olive oil, canned tuna, canned tomatoes, rice, pasta, and beans. Cooking  Cook foods with extra-virgin olive oil instead of using butter or other vegetable oils.  Have meat as a side dish, and have vegetables or grains as your main dish. This means having meat in small portions or adding small amounts of meat to foods like pasta or stew.  Use beans or vegetables instead of meat in common dishes like chili or lasagna.  Experiment with different cooking methods. Try roasting or broiling vegetables instead of steaming or sauteing them.  Add frozen vegetables to soups, stews, pasta, or rice.  Add nuts or seeds for added healthy fat at each meal. You can add these to yogurt, salads, or vegetable dishes.  Marinate fish or vegetables using olive oil, lemon juice, garlic, and fresh herbs. Meal planning   Plan to eat 1 vegetarian meal one day each week. Try to work up to 2 vegetarian meals, if possible.  Eat seafood 2 or more times a week.  Have healthy snacks readily available, such as: ? Vegetable sticks with hummus. ? [place] yogurt. ? Fruit and nut trail mix.  Eat balanced meals throughout the week. This includes: ? Fruit: 2-3 servings a day ? Vegetables: 4-5 servings a day ? Low-fat dairy: 2 servings a day ? Fish, poultry, or lean meat: 1 serving a day ? Beans and legumes: 2 or more servings a week ? Nuts and seeds: 1-2 servings a day ? Whole grains: 6-8 servings a day ? Extra-virgin olive oil: 3-4 servings a day  Limit red meat and sweets to only a few servings a month What are my food choices?  Mediterranean diet ? Recommended  Grains: Whole-grain pasta. Brown rice. Bulgar wheat. Polenta.  Couscous. Whole-wheat bread. Orpah Cobbatmeal. Quinoa.  Vegetables: Artichokes. Beets. Broccoli. Cabbage. Carrots. Eggplant. Green beans. Chard. Kale. Spinach. Onions. Leeks. Peas. Squash. Tomatoes. Peppers. Radishes.  Fruits: Apples. Apricots. Avocado. Berries. Bananas. Cherries. Dates. Figs. Grapes. Lemons. Melon. Oranges. Peaches. Plums. Pomegranate.  Meats and other protein foods: Beans. Almonds. Sunflower seeds. Pine nuts. Peanuts. Cod. Salmon. Scallops. Shrimp. Tuna. Tilapia. Clams. Oysters. Eggs.  Dairy: Low-fat milk. Cheese. Greek yogurt.  Beverages: Water. Red wine. Herbal tea.  Fats and oils: Extra virgin olive oil. Avocado oil. Grape seed oil.  Sweets and desserts: [place] yogurt with honey. Baked apples. Poached pears. Trail mix.  Seasoning and other foods: Basil. Cilantro. Coriander. Cumin. Mint. Parsley. Sage. Rosemary. Tarragon. Garlic. Oregano. Thyme. Pepper. Balsalmic vinegar. Tahini. Hummus. Tomato sauce. Olives. Mushrooms. ? Limit these  Grains: Prepackaged pasta or rice dishes. Prepackaged cereal with added sugar.  Vegetables: Deep fried potatoes (french fries).  Fruits: Fruit canned in syrup.  Meats and other protein foods: Beef. Pork. Lamb. Poultry with skin. Hot dogs. Tomasa BlaseBacon.  Dairy: Ice cream. Sour cream. Whole milk.  Beverages: Juice. Sugar-sweetened soft drinks. Beer. Liquor and spirits.  Fats and oils: Butter. Canola oil. Vegetable oil. Beef fat (tallow). Lard.  Sweets and desserts: Cookies. Cakes. Pies. Candy.  Seasoning and other foods: Mayonnaise. Premade sauces and marinades. The items listed may not be a complete list. Talk with your dietitian about what dietary choices are right for you. Summary  The Mediterranean diet includes both food and lifestyle choices.  Eat a variety of fresh fruits and vegetables, beans, nuts, seeds, and whole grains.  Limit the amount of red meat and sweets that you eat.  Talk with your health care provider about whether it  is safe for you to drink red wine in moderation. This means 1 glass a day for nonpregnant women and 2 glasses a day for men. A glass of wine equals 5 oz (150 mL). This information is not intended to replace advice given to you by your health care provider. Make sure you discuss any questions you have with your health care provider. Document Released: 01/06/2016 Document Revised: 01/13/2016 Document Reviewed: 01/06/2016 Elsevier Patient Education  2020 [employer]    Coping with Quitting Smoking  Quitting smoking is a physical and mental challenge. You will face cravings, withdrawal symptoms, and temptation. Before quitting, work with your health care provider to make a plan that can help you cope. Preparation can help you quit and keep you from giving in. How can I cope with cravings? Cravings usually last for 5-10 minutes. If you get through it, the craving will pass. Consider taking the following actions to help you cope with cravings:  Keep your mouth busy: ? Chew sugar-free gum. ? Suck on hard candies or a straw. ? Brush your teeth.  Keep your hands and body busy: ? Immediately change to a different activity when you feel a craving. ? Squeeze or play with a ball. ? Do an activity or a hobby, like making bead jewelry, practicing needlepoint, or working with wood. ? Mix up your normal routine. ? Take a short exercise break. Go for a quick walk or run up and down stairs. ? Spend time in public places where smoking is not allowed.  Focus on doing something kind or helpful for someone else.  Call a friend or family member to talk during a craving.  Join a support group.  Call a quit line, such as 1-800-QUIT-NOW.  Talk with your health care provider about medicines that might help you cope with cravings and make quitting easier for you. How can I deal with withdrawal symptoms? Your body may experience negative effects as it tries to get used to not having nicotine in the system.  These effects are called withdrawal symptoms. They may include:  Feeling hungrier than normal.  Trouble concentrating.  Irritability.  Trouble sleeping.  Feeling depressed.  Restlessness and agitation.  Craving a cigarette. To manage withdrawal symptoms:  Avoid places, people, and activities that trigger your cravings.  Remember why you want to quit.  Get plenty of sleep.  Avoid coffee and other caffeinated drinks. These may worsen some of your symptoms. How can I handle social situations? Social situations can be difficult when you are quitting smoking, especially in the first few weeks. To manage this, you can:  Avoid parties, bars, and other social situations where people might be smoking.  Avoid alcohol.  Leave right away if you have the urge to smoke.  Explain to your family and friends that you are quitting smoking. Ask for understanding and  support.  Plan activities with friends or family where smoking is not an option. What are some ways I can cope with stress? Wanting to smoke may cause stress, and stress can make you want to smoke. Find ways to manage your stress. Relaxation techniques can help. For example:  Breathe slowly and deeply, in through your nose and out through your mouth.  Listen to soothing, relaxing music.  Talk with a family member or friend about your stress.  Light a candle.  Soak in a bath or take a shower.  Think about a peaceful place. What are some ways I can prevent weight gain? Be aware that many people gain weight after they quit smoking. However, not everyone does. To keep from gaining weight, have a plan in place before you quit and stick to the plan after you quit. Your plan should include:  Having healthy snacks. When you have a craving, it may help to: ? Eat plain popcorn, crunchy carrots, celery, or other cut vegetables. ? Chew sugar-free gum.  Changing how you eat: ? Eat small portion sizes at meals. ? Eat 4-6 small  meals throughout the day instead of 1-2 large meals a day. ? Be mindful when you eat. Do not watch television or do other things that might distract you as you eat.  Exercising regularly: ? Make time to exercise each day. If you do not have time for a long workout, do short bouts of exercise for 5-10 minutes several times a day. ? Do some form of strengthening exercise, like weight lifting, and some form of aerobic exercise, like running or swimming.  Drinking plenty of water or other low-calorie or no-calorie drinks. Drink 6-8 glasses of water daily, or as much as instructed by your health care provider. Summary  Quitting smoking is a physical and mental challenge. You will face cravings, withdrawal symptoms, and temptation to smoke again. Preparation can help you as you go through these challenges.  You can cope with cravings by keeping your mouth busy (such as by chewing gum), keeping your body and hands busy, and making calls to family, friends, or a helpline for people who want to quit smoking.  You can cope with withdrawal symptoms by avoiding places where people smoke, avoiding drinks with caffeine, and getting plenty of rest.  Ask your health care provider about the different ways to prevent weight gain, avoid stress, and handle social situations. This information is not intended to replace advice given to you by your health care provider. Make sure you discuss any questions you have with your health care provider. Document Released: 05/12/2016 Document Revised: 04/27/2017 Document Reviewed: 05/12/2016 Elsevier Patient Education  2020 [employer]

## 2018-12-28 LAB — BASIC METABOLIC PANEL
BUN/Creatinine Ratio: 16 (ref 9–23)
BUN: 16 mg/dL (ref 6–24)
CO2: 18 mmol/L — ABNORMAL LOW (ref 20–29)
Calcium: 9.9 mg/dL (ref 8.7–10.2)
Chloride: 105 mmol/L (ref 96–106)
Creatinine, Ser: 0.99 mg/dL (ref 0.57–1.00)
GFR calc Af Amer: 81 mL/min/{1.73_m2} (ref >59)
GFR calc non Af Amer: 71 mL/min/{1.73_m2} (ref >59)
Glucose: 97 mg/dL (ref 65–99)
Potassium: 4.1 mmol/L (ref 3.5–5.2)
Sodium: 138 mmol/L (ref 134–144)

## 2019-01-19 NOTE — Progress Notes (Signed)
Patient ID: Isabel [name]                 DOB: Apr 24, 1976                      MRN: [id number]     HPI: Darlin DropSonya P Chambless is a 43 y.o. female referred by Dr. Tenny Crawoss to HTN clinic. PMH is significant for HTN, NSTEMI, CAD s/p DES to RCA on 12/20/18. Patient was last seen by Berton BonJanine Hammond, NP on 12/27/18. At this visit, BP was 160/98 and amlodipine was increased from 5 mg to 10 mg daily.  Patient presents to HTN clinic today for initial visit. She says she has been experiences increased stress and anxiety with her four children starting virtual school. She has made great improvements in her diet and exercise, but still has elevated blood pressures at home. Weight has fluctuated up and down ~5 lbs. She is still smoking four cigarettes per day but is not interesting in trying nicotine replacement therapy at this time. She says she has quit in the past on her own and believes she can accomplish this again once her anxiety levels improve. Her BP in clinic today remains elevated at 182/86.  Patient denies shortness of breath, swelling, dizziness, or lightheadedness.  Current HTN meds: amlodipine 10 mg daily, metoprolol 50 mg BID, spironolactone 25 mg daily  Previously tried: atenolol 50 mg BID (age 628-38 - stopped because lost insurance)   BP goal: <130/80  Family History: Diabetes in her maternal grandmother and paternal grandmother; Heart attack (age of onset: 4255) in her maternal grandmother; Heart attack (age of onset: 8464) in her mother; Heart attack (age of onset: 6074) in her paternal grandfather; Hypertension in her father, mother, paternal grandfather, paternal grandmother, and sister.  Social History: smoker 1 ppd until event - currently 4 cigarettes per day (has been at this cutback for about a month)  Diet: typically only eats dinner, occasionally eats lunch but never breakfast. Eats salmon, whole wheat pita bread, frozen green beans Tries to keep sodium under 1000 mg per day Drinks  decaffeinated tea and water (used to drink Dr. Reino KentPepper before cardiac event)  Exercise: runs errands with her 4 boys throughout the day, walks through neighborhood for 30 mins per day  Home BP readings: 154-190/92-100 mmHg  Home HR readings: 60-64 bpm  Wt Readings from Last 3 Encounters:  12/27/18 179 lb (81.2 kg)  12/21/18 181 lb 14.1 oz (82.5 kg)   BP Readings from Last 3 Encounters:  12/27/18 (!) 160/98  12/21/18 (!) 147/75  12/17/18 140/60   Pulse Readings from Last 3 Encounters:  12/27/18 74  12/21/18 66  12/17/18 (!) 110    Renal function: CrCl cannot be calculated (Patient's most recent lab result is older than the maximum 21 days allowed.).  Past Medical History:  Diagnosis Date  . Hypertension   . NSTEMI (non-ST elevated myocardial infarction) (HCC) 12/19/2018    Current Outpatient Medications on File Prior to Visit  Medication Sig Dispense Refill  . amLODipine (NORVASC) 10 MG tablet Take 1 tablet (10 mg total) by mouth daily. 180 tablet 3  . aspirin 81 MG chewable tablet Chew 1 tablet (81 mg total) by mouth daily.    Marland Kitchen. atorvastatin (LIPITOR) 80 MG tablet Take 1 tablet (80 mg total) by mouth daily at 6 PM. 90 tablet 3  . metoprolol tartrate (LOPRESSOR) 50 MG tablet Take 1 tablet (50 mg total) by mouth 2 (two) times daily.  60 tablet 5  . Multiple Vitamins-Calcium (ONE-A-DAY WOMENS PO) Take 1 tablet by mouth daily with breakfast.    . nitroGLYCERIN (NITROSTAT) 0.4 MG SL tablet Place 1 tablet (0.4 mg total) under the tongue every 5 (five) minutes x 3 doses as needed for chest pain. 25 tablet 3  . spironolactone (ALDACTONE) 25 MG tablet Take 1 tablet (25 mg total) by mouth 2 (two) times daily. 30 tablet 5  . ticagrelor (BRILINTA) 90 MG TABS tablet Take 1 tablet (90 mg total) by mouth 2 (two) times daily. 180 tablet 3   No current facility-administered medications on file prior to visit.     No Known Allergies   Assessment/Plan:  1. Hypertension - BP remains  elevated in clinic today (goal < 130/80). Add lisinopril 10 mg once daily. Continue taking  amlodipine 10 mg daily, metoprolol 50 mg BID, and spironolactone 25 mg daily. Educated on the importance of eating balanced meals throughout the day, how canned and frozen foods contain higher amounts of sodium compared to fresh foods, and encouraged to continue her progress with making her diet improvements despite no weight loss. Labs on 8/27 - will follow up and make any adjustments to medications as needed.  Instructed to continue recording blood pressure at home and to bring log to next visit.   2. Anxiety - Instructed patient to follow up with Dr. Harrington Challenger or PCP. Patient has taken anti-anxiety medications in the past but no longer follows with that PCP.  3. Tobacco abuse - Pt has cut back from smoking 1 PPD to 4 cigarettes per day. Discussed the benefits of tobacco cessation. Pt declines pharmacologic aids at this time and wishes to try to quit once her stress levels fall. Will reassess at next visit.  Follow up with HTN clinic in 3 weeks.  Vertis Kelch, PharmD PGY2 Cardiology Pharmacy Resident [facility] [address], [place], [place] [address] Phone: [phone]; Fax: [phone]

## 2019-01-20 ENCOUNTER — Other Ambulatory Visit: Payer: Self-pay

## 2019-01-20 ENCOUNTER — Ambulatory Visit (INDEPENDENT_AMBULATORY_CARE_PROVIDER_SITE_OTHER): Payer: Self-pay

## 2019-01-20 VITALS — BP 182/86 | HR 67

## 2019-01-20 DIAGNOSIS — Z720 Tobacco use: Secondary | ICD-10-CM

## 2019-01-20 DIAGNOSIS — I10 Essential (primary) hypertension: Secondary | ICD-10-CM

## 2019-01-20 MED ORDER — LISINOPRIL 10 MG PO TABS: 10.0000 mg | ORAL_TABLET | Freq: Every day | ORAL | 11 refills | Status: DC

## 2019-01-20 NOTE — Patient Instructions (Addendum)
Thanks for seeing [place] today!  Your blood pressure today was 182/86.   Please start taking lisinopril 1 tablet (10 mg) once daily. Continue taking your metoprolol, amlodipine, and spironolactone as previously prescribed.   Lab work on 8/27 - we will call with any medication changes.  Your next appointment with [place] is Tuesday, 9/15 at 2:30 pm.   Please call us at [phone] if you have any questions.

## 2019-01-23 ENCOUNTER — Ambulatory Visit (HOSPITAL_BASED_OUTPATIENT_CLINIC_OR_DEPARTMENT_OTHER): Payer: Self-pay | Admitting: Pharmacist

## 2019-01-23 ENCOUNTER — Encounter: Payer: Self-pay | Admitting: Family Medicine

## 2019-01-23 ENCOUNTER — Ambulatory Visit: Payer: Self-pay | Attending: Family Medicine | Admitting: Family Medicine

## 2019-01-23 ENCOUNTER — Other Ambulatory Visit: Payer: Self-pay | Admitting: *Deleted

## 2019-01-23 ENCOUNTER — Other Ambulatory Visit: Payer: Self-pay

## 2019-01-23 VITALS — BP 130/82 | HR 61 | Temp 98.4°F | Resp 18 | Ht 65.5 in | Wt 183.0 lb

## 2019-01-23 DIAGNOSIS — Z09 Encounter for follow-up examination after completed treatment for conditions other than malignant neoplasm: Secondary | ICD-10-CM

## 2019-01-23 DIAGNOSIS — Z23 Encounter for immunization: Secondary | ICD-10-CM

## 2019-01-23 DIAGNOSIS — I2510 Atherosclerotic heart disease of native coronary artery without angina pectoris: Secondary | ICD-10-CM

## 2019-01-23 DIAGNOSIS — I10 Essential (primary) hypertension: Secondary | ICD-10-CM

## 2019-01-23 DIAGNOSIS — E785 Hyperlipidemia, unspecified: Secondary | ICD-10-CM

## 2019-01-23 DIAGNOSIS — Z7689 Persons encountering health services in other specified circumstances: Secondary | ICD-10-CM

## 2019-01-23 DIAGNOSIS — Z720 Tobacco use: Secondary | ICD-10-CM

## 2019-01-23 LAB — LIPID PANEL
Chol/HDL Ratio: 4 ratio (ref 0.0–4.4)
Cholesterol, Total: 113 mg/dL (ref 100–199)
HDL: 28 mg/dL — ABNORMAL LOW (ref >39)
LDL Calculated: 55 mg/dL (ref 0–99)
Triglycerides: 151 mg/dL — ABNORMAL HIGH (ref 0–149)
VLDL Cholesterol Cal: 30 mg/dL (ref 5–40)

## 2019-01-23 LAB — HEPATIC FUNCTION PANEL
ALT: 13 IU/L (ref 0–32)
AST: 11 IU/L (ref 0–40)
Albumin: 4.4 g/dL (ref 3.8–4.8)
Alkaline Phosphatase: 105 IU/L (ref 39–117)
Bilirubin Total: 0.3 mg/dL (ref 0.0–1.2)
Bilirubin, Direct: 0.09 mg/dL (ref 0.00–0.40)
Total Protein: 6.8 g/dL (ref 6.0–8.5)

## 2019-01-23 NOTE — Progress Notes (Signed)
Patient presents for vaccination against influenza per orders of Dr. Fulp. Consent given. Counseling provided. No contraindications exists. Vaccine administered without incident.   

## 2019-01-23 NOTE — Progress Notes (Signed)
Patient ID: Isabel [name], female    DOB: 1976/02/04  MRN: [id number]   SUBJECTIVE:  Alesia BandaSonya Bautch is a 43 y.o. female who presents for hospital f/u. [facility] When: 12/19/2018-12/21/2018 Primary Dx: NSTEMI, HTN, CAD, Hyperlipidemia, Hypokalemia  HPI:      43 year old female who is status post recent hospitalization for myocardial infarction.  Patient does have a family history of heart disease.  Patient states that she had actually gone to the emergency department on 12/17/2018 but after sitting in the emergency department for about 6 hours, her chest pain improved therefore she left.  She states that she did have an EKG and some blood work in the emergency department but no one ever came back to tell her that any of the test were abnormal and she did not see the actual ED medical provider.  Patient states that she has 4 sons and since her chest pain improved she went home as she had a lot of other things that she needed to do.  Patient states she followed up at her primary care physician's office and was told that she had an abnormal EKG and needed to go back to the hospital.  Patient states that she had actually been having chest pain off and on for possibly up to a month or more before her ED visit.  She also has a history of hypertension and had not been on blood pressure medication but her blood pressure was high in the emergency department and this was also why she went to see her primary care doctor in order to receive refills of blood pressure medications.  Patient states that she is attentive person who tries to help out anyone else in her family who asked for her help in addition to raising her 4 sons as she is not currently working and her husband is at work for most of the day until the evening.  She also smoked approximately 1 pack/day of cigarettes prior to her hospitalization as she states that this helped to calm her nerves.  She knows that she should completely  stop smoking but still smokes between 5 to 8 cigarettes/day.  She tends to be anxious and worry a lot and she feels that smoking helps to calm her nerves.  She currently has difficulty falling asleep and staying asleep due to her issues with worrying.  She reports that she currently has all of her medications and her biggest concern was the cost of Brilinta however her mother had been on this medication in the past and help the patient fill out paperwork to receive the medication for free through the manufacturer and patient has recently been approved and has received samples.   Patient Active Problem List   Diagnosis Date Noted  . Tobacco abuse 01/20/2019  . CAD (coronary artery disease) 12/21/2018  . Hyperlipidemia 12/21/2018  . Hypokalemia 12/21/2018  . NSTEMI (non-ST elevated myocardial infarction) (HCC) 12/19/2018  . Hypertension      Current Outpatient Medications on File Prior to Visit  Medication Sig Dispense Refill  . amLODipine (NORVASC) 10 MG tablet Take 1 tablet (10 mg total) by mouth daily. 180 tablet 3  . aspirin 81 MG chewable tablet Chew 1 tablet (81 mg total) by mouth daily.    Marland Kitchen. atorvastatin (LIPITOR) 80 MG tablet Take 1 tablet (80 mg total) by mouth daily at 6 PM. 90 tablet 3  . lisinopril (ZESTRIL) 10 MG tablet Take 1 tablet (10 mg total) by mouth daily.  30 tablet 11  . metoprolol tartrate (LOPRESSOR) 50 MG tablet Take 1 tablet (50 mg total) by mouth 2 (two) times daily. 60 tablet 5  . Multiple Vitamins-Calcium (ONE-A-DAY WOMENS PO) Take 1 tablet by mouth daily with breakfast.    . nitroGLYCERIN (NITROSTAT) 0.4 MG SL tablet Place 1 tablet (0.4 mg total) under the tongue every 5 (five) minutes x 3 doses as needed for chest pain. 25 tablet 3  . spironolactone (ALDACTONE) 25 MG tablet Take 1 tablet (25 mg total) by mouth 2 (two) times daily. 30 tablet 5  . ticagrelor (BRILINTA) 90 MG TABS tablet Take 1 tablet (90 mg total) by mouth 2 (two) times daily. 180 tablet 3   No  current facility-administered medications on file prior to visit.     No Known Allergies  Social History   Tobacco Use  . Smoking status: Light Tobacco Smoker    Packs/day: 0.25    Types: Cigarettes  . Smokeless tobacco: Never Used  . Tobacco comment: down to 4 cigarrettes per day  Substance Use Topics  . Alcohol use: Never    Frequency: Never  . Drug use: Never     Family History  Problem Relation Age of Onset  . Heart attack Mother 90  . Hypertension Mother   . Hypertension Father   . Heart attack Maternal Grandmother 55  . Diabetes Maternal Grandmother   . Hypertension Sister   . Hypertension Paternal Grandmother   . Breast cancer Paternal Grandmother   . Diabetes Paternal Grandmother   . Cervical cancer Paternal Grandmother   . Heart attack Paternal Grandfather 74  . Hypertension Paternal Grandfather     Past Surgical History:  Procedure Laterality Date  . CESAREAN SECTION    . CHOLECYSTECTOMY    . CORONARY STENT INTERVENTION N/A 12/20/2018   Procedure: CORONARY STENT INTERVENTION;  Surgeon: Lorretta Harp, MD;  Location: [facility];  Service: Cardiovascular;  Laterality: N/A;  . LEFT HEART CATH AND CORONARY ANGIOGRAPHY N/A 12/20/2018   Procedure: LEFT HEART CATH AND CORONARY ANGIOGRAPHY;  Surgeon: Lorretta Harp, MD;  Location: [facility];  Service: Cardiovascular;  Laterality: N/A;    ROS: Review of Systems  Constitutional: Positive for fatigue (Improving). Negative for chills, diaphoresis and fever.  HENT: Negative for sore throat and trouble swallowing.   Eyes: Negative for photophobia and visual disturbance.  Respiratory: Negative for cough and shortness of breath.   Cardiovascular: Negative for chest pain, palpitations and leg swelling.  Gastrointestinal: Negative for abdominal pain, constipation, diarrhea and nausea.  Endocrine: Negative for cold intolerance, heat intolerance, polydipsia, polyphagia and polyuria.  Genitourinary:  Negative for dysuria and frequency.  Musculoskeletal: Negative for arthralgias and back pain.  Neurological: Negative for dizziness and headaches.  Hematological: Negative for adenopathy. Bruises/bleeds easily.  Psychiatric/Behavioral: Positive for sleep disturbance. Negative for self-injury and suicidal ideas. The patient is nervous/anxious.      PHYSICAL EXAM: BP 130/82 (BP Location: Left Arm, Patient Position: Sitting, Cuff Size: Large)   Pulse 61   Temp 98.4 F (36.9 C) (Oral)   Resp 18   Ht 5' 5.5" (1.664 m)   Wt 183 lb (83 kg)   LMP 01/02/2019   SpO2 100%   BMI 29.99 kg/m    Physical Exam Vitals signs and nursing note reviewed.  Constitutional:      Appearance: Normal appearance.  Neck:     Musculoskeletal: Normal range of motion and neck supple. No muscular tenderness.  Vascular: No carotid bruit.  Cardiovascular:     Rate and Rhythm: Normal rate and regular rhythm.  Pulmonary:     Effort: Pulmonary effort is normal.     Breath sounds: Normal breath sounds.  Abdominal:     Tenderness: There is no abdominal tenderness. There is no right CVA tenderness, left CVA tenderness, guarding or rebound.  Musculoskeletal:        General: No swelling or tenderness.     Right lower leg: No edema.     Left lower leg: No edema.  Lymphadenopathy:     Cervical: No cervical adenopathy.  Skin:    General: Skin is warm and dry.     Findings: Bruising (Scattered mild bruising in different stages on the forearms and lower legs-about 4 bruises in total) present.  Neurological:     General: No focal deficit present.     Mental Status: She is alert and oriented to person, place, and time.  Psychiatric:        Mood and Affect: Mood normal.     Comments: Slightly anxious initially but less anxious  as the visit progressed     ASSESSMENT AND PLAN: 1. Coronary artery disease involving native coronary artery of native heart without angina pectoris; Hospital discharge follow-up;  encounter to establish care Patient's hospital records from her recent hospitalization as well as procedures and labs/imaging reviewed and discussed with the patient and all questions were answered to her satisfaction.  Discussed the need for secondary prevention including controlling cholesterol, controlling blood pressure, smoking cessation and controlling stress.  Patient was offered social work consultation and/or medication to help with stress which she declined at this time.  Handout on coping with stress was provided as part of after visit summary.  2. Essential hypertension Patient will continue her current blood pressure medications.  On review of chart, specialty appointment visits, today's blood pressure is finally at goal of 130/80.  Continue amlodipine 10 mg daily and lisinopril 10 mg daily as well as Lopressor 50 mg twice daily and spironolactone.  Patient had blood work done earlier today which was ordered by cardiology therefore no blood work done at today's visit.  3. Tobacco use Importance of complete smoking cessation was discussed with the patient especially in light of her recent heart attack.  Discussed ways to stop smoking as well as medication such as nicotine lozenges.  Patient was offered referral to clinical pharmacist to further discuss smoking cessation which she declined at this time.  Future Appointments  Date Time Provider Department Center  02/11/2019  2:30 PM [facility]   An After Visit Summary was printed and given to the patient.  *Patient did receive influenza immunization while here in the office by clinical pharmacist or medical assistant  Cain Saupe, MD, FACP

## 2019-01-23 NOTE — Patient Instructions (Signed)
Mindfulness-Based Stress Reduction Mindfulness-based stress reduction (MBSR) is a program that helps people learn to practice mindfulness. Mindfulness is the practice of intentionally paying attention to the present moment. It can be learned and practiced through techniques such as education, breathing exercises, meditation, and yoga. MBSR includes several mindfulness techniques in one program. MBSR works best when you understand the treatment, are willing to try new things, and can commit to spending time practicing what you learn. MBSR training may include learning about:  How your emotions, thoughts, and reactions affect your body.  New ways to respond to things that cause negative thoughts to start (triggers).  How to notice your thoughts and let go of them.  Practicing awareness of everyday things that you normally do without thinking.  The techniques and goals of different types of meditation. What are the benefits of MBSR? MBSR can have many benefits, which include helping you to:  Develop self-awareness. This refers to knowing and understanding yourself.  Learn skills and attitudes that help you to participate in your own health care.  Learn new ways to care for yourself.  Be more accepting about how things are, and let things go.  Be less judgmental and approach things with an open mind.  Be patient with yourself and trust yourself more. MBSR has also been shown to:  Reduce negative emotions, such as depression and anxiety.  Improve memory and focus.  Change how you sense and approach pain.  Boost your body's ability to fight infections.  Help you connect better with other people.  Improve your sense of well-being. Follow these instructions at home:   Find a local in-person or online MBSR program.  Set aside some time regularly for mindfulness practice.  Find a mindfulness practice that works best for you. This may include one or more of the following: ?  Meditation. Meditation involves focusing your mind on a certain thought or activity. ? Breathing awareness exercises. These help you to stay present by focusing on your breath. ? Body scan. For this practice, you lie down and pay attention to each part of your body from head to toe. You can identify tension and soreness and intentionally relax parts of your body. ? Yoga. Yoga involves stretching and breathing, and it can improve your ability to move and be flexible. It can also provide an experience of testing your body's limits, which can help you release stress. ? Mindful eating. This way of eating involves focusing on the taste, texture, color, and smell of each bite of food. Because this slows down eating and helps you feel full sooner, it can be an important part of a weight-loss plan.  Find a podcast or recording that provides guidance for breathing awareness, body scan, or meditation exercises. You can listen to these any time when you have a free moment to rest without distractions.  Follow your treatment plan as told by your health care provider. This may include taking regular medicines and making changes to your diet or lifestyle as recommended. How to practice mindfulness To do a basic awareness exercise:  Find a comfortable place to sit.  Pay attention to the present moment. Observe your thoughts, feelings, and surroundings just as they are.  Avoid placing judgment on yourself, your feelings, or your surroundings. Make note of any judgment that comes up, and let it go.  Your mind may wander, and that is okay. Make note of when your thoughts drift, and return your attention to the present moment. To do   basic mindfulness meditation:  Find a comfortable place to sit. This may include a stable chair or a firm floor cushion. ? Sit upright with your back straight. Let your arms fall next to your side with your hands resting on your legs. ? If sitting in a chair, rest your feet flat on  the floor. ? If sitting on a cushion, cross your legs in front of you.  Keep your head in a neutral position with your chin dropped slightly. Relax your jaw and rest the tip of your tongue on the roof of your mouth. Drop your gaze to the floor. You can close your eyes if you like.  Breathe normally and pay attention to your breath. Feel the air moving in and out of your nose. Feel your belly expanding and relaxing with each breath.  Your mind may wander, and that is okay. Make note of when your thoughts drift, and return your attention to your breath.  Avoid placing judgment on yourself, your feelings, or your surroundings. Make note of any judgment or feelings that come up, let them go, and bring your attention back to your breath.  When you are ready, lift your gaze or open your eyes. Pay attention to how your body feels after the meditation. Where to find more information You can find more information about MBSR from:  Your health care provider.  Community-based meditation centers or programs.  Programs offered near you. Summary  Mindfulness-based stress reduction (MBSR) is a program that teaches you how to intentionally pay attention to the present moment. It is used with other treatments to help you cope better with daily stress, emotions, and pain.  MBSR focuses on developing self-awareness, which allows you to respond to life stress without judgment or negative emotions.  MBSR programs may involve learning different mindfulness practices, such as breathing exercises, meditation, yoga, body scan, or mindful eating. Find a mindfulness practice that works best for you, and set aside time for it on a regular basis. This information is not intended to replace advice given to you by your health care provider. Make sure you discuss any questions you have with your health care provider. Document Released: 09/21/2016 Document Revised: 04/27/2017 Document Reviewed: 09/21/2016 Elsevier  Patient Education  2020 Elsevier Inc.  

## 2019-01-29 LAB — SPECIMEN STATUS REPORT

## 2019-01-29 LAB — BASIC METABOLIC PANEL
BUN/Creatinine Ratio: 18 (ref 9–23)
BUN: 24 mg/dL (ref 6–24)
CO2: 14 mmol/L — ABNORMAL LOW (ref 20–29)
Calcium: 9.8 mg/dL (ref 8.7–10.2)
Chloride: 109 mmol/L — ABNORMAL HIGH (ref 96–106)
Creatinine, Ser: 1.31 mg/dL — ABNORMAL HIGH (ref 0.57–1.00)
GFR calc Af Amer: 58 mL/min/{1.73_m2} — ABNORMAL LOW (ref >59)
GFR calc non Af Amer: 50 mL/min/{1.73_m2} — ABNORMAL LOW (ref >59)
Glucose: 96 mg/dL (ref 65–99)
Potassium: 4.4 mmol/L (ref 3.5–5.2)
Sodium: 142 mmol/L (ref 134–144)

## 2019-02-10 NOTE — Patient Instructions (Addendum)
It was a pleasure seeing you in clinic today Mrs. Lichter!  Today the plan is  1. STOP taking atorvastatin  2. START taking pravastatin  3. We will contact you regarding your lab results and inform you what blood pressure medication we will adjust.  4. Keep up the great work on your healthy diet, exercise, and decrease in smoking! You should be proud of yourself :)   Please call the PharmD clinic at [phone] if you have any questions that you would like to speak with a pharmacist about [facility], [place], [place]).

## 2019-02-10 NOTE — Progress Notes (Signed)
Patient ID: Isabel LeiterSonya P [name]                 DOB: March 06, 1976                      MRN: [id number]     HPI: Isabel [name] is a 43 y.o. female referred by Dr. Tenny Crawoss to HTN clinic. PMH is significant for HTN, NSTEMI, CAD s/p DES to RCA on 12/20/18. At last PharmD appt (01/20/19), lisinopril 10 mg daily was initiated. Labs from 01/23/19 showed an increase in SCr from 0.99 to 1.31, however, an increase up to 30% in Scr can be anticipated after initiation of ACE-inhibitor. Pt's BP was 130/82 mmHg at most [facility] appt with Dr.Fulp; appears initiation of lisinopril has decreased blood pressure.  Pt presents today for f/u HTN clinic appt. She is tolerating recent initiation of lisinopril and is not experiencing s/sx of hypotension. Patient takes BP meds at 9:15 AM and 9:15 PM.   Patient is concerned about mylagias she is experiencing in her calves/arms that are likely due to atorvastatin use. Pt would like to know if she is able to use tumeric instead of atorvastatin.   Since her ASCVD event in 11/2018 she has completely changed her lifestyle. Pt continues to eat healthy diet and exercise as frequently as possible. She is currently smoking and attempting to quit. She feels that she can pursue smoking cessation independently, especially considering the support from her family.  Current HTN meds: lisinopril 10 mg daily, amlodipine 10 mg daily, metoprolol 50 mg BID, spironolactone 25 mg daily  Previously tried: atenolol 50 mg BID (age 728-38 - stopped because lost insurance)   BP goal: <130/6380mmHg  Family History: Diabetes in her maternal grandmother and paternal grandmother; Heart attack (age of onset: 2155) in her maternal grandmother; Heart attack (age of onset: 4364) in her mother; Heart attack (age of onset: 5374) in her paternal grandfather; Hypertension in her father, mother, paternal grandfather, paternal grandmother, and sister.  Social History: smoker 1 ppd until event -  currently 4 cigarettes per day (has been at this cutback for about a month) -Tried varenicline and bupropion in the past (anger) -Has not tried patch/gum   Diet: typically only eats dinner, occasionally eats lunch but never breakfast. Eats salmon, whole wheat pita bread, frozen green beans Tries to keep sodium under 1000 mg per day Drinks decaffeinated tea and water (used to drink Dr. Reino KentPepper before cardiac event, doesn't drink anymore)  Exercise: runs errands with her 4 boys throughout the day, walks through neighborhood for 30 mins per day  Home BP readings: 150-160/80-90 mmHg -Checks once daily - varies on the time of day she is able to check it  Wt Readings from Last 3 Encounters:  01/23/19 183 lb (83 kg)  12/27/18 179 lb (81.2 kg)  12/21/18 181 lb 14.1 oz (82.5 kg)   BP Readings from Last 3 Encounters:  01/23/19 130/82  01/20/19 (!) 182/86  12/27/18 (!) 160/98   Pulse Readings from Last 3 Encounters:  01/23/19 61  01/20/19 67  12/27/18 74    Renal function: CrCl cannot be calculated (Unknown ideal weight.).  Past Medical History:  Diagnosis Date  . Hypertension   . NSTEMI (non-ST elevated myocardial infarction) (HCC) 12/19/2018    Current Outpatient Medications on File Prior to Visit  Medication Sig Dispense Refill  . amLODipine (NORVASC) 10 MG tablet Take 1 tablet (10 mg total) by mouth daily. 180  tablet 3  . aspirin 81 MG chewable tablet Chew 1 tablet (81 mg total) by mouth daily.    Marland Kitchen atorvastatin (LIPITOR) 80 MG tablet Take 1 tablet (80 mg total) by mouth daily at 6 PM. 90 tablet 3  . lisinopril (ZESTRIL) 10 MG tablet Take 1 tablet (10 mg total) by mouth daily. 30 tablet 11  . metoprolol tartrate (LOPRESSOR) 50 MG tablet Take 1 tablet (50 mg total) by mouth 2 (two) times daily. 60 tablet 5  . Multiple Vitamins-Calcium (ONE-A-DAY WOMENS PO) Take 1 tablet by mouth daily with breakfast.    . nitroGLYCERIN (NITROSTAT) 0.4 MG SL tablet Place 1 tablet (0.4 mg total)  under the tongue every 5 (five) minutes x 3 doses as needed for chest pain. 25 tablet 3  . spironolactone (ALDACTONE) 25 MG tablet Take 1 tablet (25 mg total) by mouth 2 (two) times daily. 30 tablet 5  . ticagrelor (BRILINTA) 90 MG TABS tablet Take 1 tablet (90 mg total) by mouth 2 (two) times daily. 180 tablet 3   No current facility-administered medications on file prior to visit.     No Known Allergies   Assessment/Plan:  1. Hypertension - goal BP < 130/80 mmHg; therefore, patient is not at goal. It appears addition of lisinopril has decreased blood pressure, however, pt will likely need a medication adjustment since her BP readings are still not at goal. Will likely increase lisinopril to 20 mg daily, spironolactone to 50 mg daily, or add HCTZ 12.5 mg daily depending on Scr and K levels. Will f/u with pt with lab results and medication recommendation once BMET results.  2. HLD - goal LDL < 70 mg/dL. Patient LDL is currently at goal, however, pt is experiencing significant myalgias. Will switch patient to pravastatin 40 mg daily. Plan to obtain lipid panel in 2 months from initiation of pravastatin. May consider PCSK9 inhibitor in the future if required.  3. Smoking cessation - Pt smokes ~4 cigarettes/day; therefore, is not at goal. Encourage patient for her motivation to pursue smoking cessation independently. Educated pt about pharmacotherapy options, however, pt is not interested at this time. Will f/u with patient at future appts to monitor her success.   Will schedule f/u in PharmD clinic pending lab results. Thank you for involving pharmacy to assist in providing [name]'s care.   Drexel Iha, PharmD PGY2 Ambulatory Care Pharmacy Resident  Addendum: BMET shows improved SCr, ok to increase lisinopril to 20mg  daily. Will recheck BMET at next PharmD visit in 3-4 weeks. Pt is aware.

## 2019-02-11 ENCOUNTER — Other Ambulatory Visit: Payer: Self-pay

## 2019-02-11 ENCOUNTER — Ambulatory Visit (INDEPENDENT_AMBULATORY_CARE_PROVIDER_SITE_OTHER): Payer: Self-pay | Admitting: Pharmacist

## 2019-02-11 VITALS — BP 140/84 | HR 59

## 2019-02-11 DIAGNOSIS — Z720 Tobacco use: Secondary | ICD-10-CM

## 2019-02-11 DIAGNOSIS — I10 Essential (primary) hypertension: Secondary | ICD-10-CM

## 2019-02-11 DIAGNOSIS — E782 Mixed hyperlipidemia: Secondary | ICD-10-CM

## 2019-02-12 ENCOUNTER — Telehealth: Payer: Self-pay | Admitting: Pharmacist

## 2019-02-12 LAB — BASIC METABOLIC PANEL
BUN/Creatinine Ratio: 11 (ref 9–23)
BUN: 14 mg/dL (ref 6–24)
CO2: 19 mmol/L — ABNORMAL LOW (ref 20–29)
Calcium: 9.6 mg/dL (ref 8.7–10.2)
Chloride: 105 mmol/L (ref 96–106)
Creatinine, Ser: 1.26 mg/dL — ABNORMAL HIGH (ref 0.57–1.00)
GFR calc Af Amer: 61 mL/min/{1.73_m2} (ref >59)
GFR calc non Af Amer: 53 mL/min/{1.73_m2} — ABNORMAL LOW (ref >59)
Glucose: 84 mg/dL (ref 65–99)
Potassium: 4.6 mmol/L (ref 3.5–5.2)
Sodium: 137 mmol/L (ref 134–144)

## 2019-02-12 MED ORDER — LISINOPRIL 20 MG PO TABS: 20.0000 mg | ORAL_TABLET | Freq: Every day | ORAL | 11 refills | Status: DC

## 2019-02-12 MED ORDER — PRAVASTATIN SODIUM 40 MG PO TABS: 40.0000 mg | ORAL_TABLET | Freq: Every evening | ORAL | 11 refills | Status: DC

## 2019-02-12 NOTE — Telephone Encounter (Signed)
Specifically, calling pt about about her BMP results from 02/11/19. Her Scr has decreased from 1.31 to 1.26, however, remains elevated. Planning to discuss with pt the importance of staying hydrated and avoiding NSAIDs. Increase dose of lisinopril from 10 mg daily to 20 mg daily. Follow up with pt in 3-4 weeks to evaluate blood pressure management and obtain BMP to monitor her renal function closely.

## 2019-02-12 NOTE — Telephone Encounter (Signed)
Pt aware of lab results and dose increase of lisinopril. Scheduled f/u BP check in 4 weeks.

## 2019-02-12 NOTE — Telephone Encounter (Signed)
Called pt on 02/12/19 at 10:05 AM. Left HIPAA compliant voicemail with instructions to call [facility] and/or to expect an additional call attempt later this afternoon.

## 2019-02-12 NOTE — Telephone Encounter (Signed)
Called pt again 02/12/19 at 1:50 PM. Left HIPAA complaint voicemail with instructions to call [facility]. (unsuccessful attempt #2)

## 2019-02-17 ENCOUNTER — Telehealth (HOSPITAL_COMMUNITY): Payer: Self-pay

## 2019-02-17 ENCOUNTER — Encounter (HOSPITAL_COMMUNITY): Payer: Self-pay

## 2019-02-17 NOTE — Telephone Encounter (Signed)
Attempted to contact pt regarding [facility] left on voicemail regarding [facility].   Mailed letter

## 2019-03-04 NOTE — Telephone Encounter (Signed)
No response from pt regarding CR.  Closed referral.  

## 2019-03-09 NOTE — Progress Notes (Signed)
Patient ID: Isabel [name]                 DOB: 27-Jun-1975                      MRN: [id number]     HPI: Isabel [name] is a 43 y.o. female referred by Dr. Devoria Albe HTN clinic.PMH is significant for HTN, NSTEMI, CAD s/p DES to RCA on 12/20/18. At last PharmD appt (02/11/19), lisinopril was increased from 10 mg daily to 20 mg daily. Of note, pt does not have prescription drug coverage.   Pt presents for f/u appt with HTN/lipid clinic. She is not experiencing any s/sx of hypotension and is tolerating lisinopril. She still takes BP meds at 9:15 AM and 9:15 PM. She checks her BP daily at night.   She is tolerating initiation of pravastatin. She reports musculoskeletal pain has significantly improved. She does admit to having slight myalgias in lower leg (specifically feet/ankle area) after walking longer distance.  She reports adherence to all medications.  She has decreased cigarette use from 4 per day to 2 per day. She remains having success with her four sons "nagging her" to decrease use.   She reports making significant improvements with her diet/exercise and is trying to help her mom and dad implement these lifestyle changes as well.    Current HTN meds:lisinopril 20 mg daily, amlodipine 10 mg daily, metoprolol 50 mg BID, spironolactone 25 mg daily Previously tried:atenolol 50 mg BID (age 12-38 - stopped because lost insurance) BP goal:<130/23mmHg  Current HLD Medications: pravastatin 40 mg daily HLD Intolerances: atorvastatin 80 mg daily (myalgias) Risk Factors: HTN, NSTEMI, CAD (s/p DES to RCA), current smoker  LDL goal: <70 mg/dL  Family History:Diabetes in her maternal grandmother and paternal grandmother; Heart attack (age of onset: 57) in her maternal grandmother; Heart attack (age of onset: 66) in her mother; Heart attack (age of onset: 91) in her paternal grandfather; Hypertension in her father, mother, paternal grandfather, paternal grandmother, and sister.  Social  History:smoker 1 ppd until event - currently 2 cigarettes per day (has been at this cutback for about a month) -Tried varenicline and bupropion in the past (anger) -Has not tried patch/gum   Diet:typically only eats dinner, occasionally eats lunch but never breakfast. Eats salmon, whole wheat pita bread, frozen green beans Tries to keep sodium under 1000 mg per day Drinks decaffeinated tea and water (used to drink Dr. Reino Kent before cardiac event, doesn't drink anymore)  Exercise:runs errands with her 4 boys throughout the day, walks through neighborhood for 30 mins per day  Home BP readings (checks every night):  130-140/80-85 mmHgs  Maybe one reading high - ~150/90 mmHg   Labs: 01/23/19 TC 113 HDL 28 LDL (calc) 55 TG 151; atorvastatin 80 mg daily 12/19/18 Chol 206 HDL 48 LDL (calc) 122 TG 178 VLDL 36; none  Wt Readings from Last 3 Encounters:  01/23/19 183 lb (83 kg)  12/27/18 179 lb (81.2 kg)  12/21/18 181 lb 14.1 oz (82.5 kg)   BP Readings from Last 3 Encounters:  02/11/19 140/84  01/23/19 130/82  01/20/19 (!) 182/86   Pulse Readings from Last 3 Encounters:  02/11/19 (!) 59  01/23/19 61  01/20/19 67    Renal function: CrCl cannot be calculated (Patient's most recent lab result is older than the maximum 21 days allowed.).  Past Medical History:  Diagnosis Date  . Hypertension   . NSTEMI (non-ST elevated myocardial infarction) (HCC)  12/19/2018    Current Outpatient Medications on File Prior to Visit  Medication Sig Dispense Refill  . amLODipine (NORVASC) 10 MG tablet Take 1 tablet (10 mg total) by mouth daily. 180 tablet 3  . aspirin 81 MG chewable tablet Chew 1 tablet (81 mg total) by mouth daily.    Marland Kitchen lisinopril (ZESTRIL) 20 MG tablet Take 1 tablet (20 mg total) by mouth daily. 30 tablet 11  . metoprolol tartrate (LOPRESSOR) 50 MG tablet Take 1 tablet (50 mg total) by mouth 2 (two) times daily. 60 tablet 5  . Multiple Vitamins-Calcium (ONE-A-DAY WOMENS PO) Take 1  tablet by mouth daily with breakfast.    . nitroGLYCERIN (NITROSTAT) 0.4 MG SL tablet Place 1 tablet (0.4 mg total) under the tongue every 5 (five) minutes x 3 doses as needed for chest pain. 25 tablet 3  . pravastatin (PRAVACHOL) 40 MG tablet Take 1 tablet (40 mg total) by mouth every evening. 30 tablet 11  . spironolactone (ALDACTONE) 25 MG tablet Take 1 tablet (25 mg total) by mouth 2 (two) times daily. 30 tablet 5  . ticagrelor (BRILINTA) 90 MG TABS tablet Take 1 tablet (90 mg total) by mouth 2 (two) times daily. 180 tablet 3   No current facility-administered medications on file prior to visit.     No Known Allergies   Assessment/Plan:  1. Hypertension - goal BP < 130/80 mmHg; therefore, pt is slightly above goal. Plan to obtain BMP today; if stable, plan call pt tomorrow with instructions to increase lisinopril 20 mg daily to 40 mg daily. F/u with pt in 1 month on 04/08/19 at [facility].    2. HLD - goal LDL < 70 mg/dL. Patient LDL is currently at goal, however, pt was experiencing significant myalgias so was switched from atorvastatin 80 mg daily to pravastatin 40 mg daily. Pt is tolerating initiation of pravastatin, however, is still experiencing myalgias. Continue pravastatin 40 mg daily for now and plan to discuss with patient PCSK9 inhibitor tomorrow during telephone f/u call.   3. Smoking cessation - Pt smokes ~2 cigarettes/day, which is a significant improvement from last visit when pt was smoking 4 cigarettes/day. Plan to continue nonpharm methods of having her "sons nag her" to quit smoking as frequently. F/u with pt at next appt about smoking cessation attempts.   Thank you for involving pharmacy to assist in providing Isabel [name]'s care.  Drexel Iha, PharmD PGY2 Ambulatory Care Pharmacy Resident

## 2019-03-09 NOTE — Patient Instructions (Addendum)
It was a pleasure seeing you in clinic today Ms. Moulin!  Today the plan is...  1. Get labs today. [facility] will call you tomorrow with instructions about increasing your lisinopril dose. If labs okay likely will increase dose from lisinopril 20 mg daily to lisinopril 40 mg daily. Will f/u with you on 04/08/19 at 2PM to see effects on BP.   2. Continue to check blood pressure daily.  3. Continue pravastatin 40 mg daily. Will f/u at next appt to see if you are still experiencing muscle pain.  4. Keep up the great work with your efforts to quit smoking (seriously -- 2 cigarettes/day is amazing!!!), healthy diet habits, and exercise!!   Please call the PharmD clinic at [phone] if you have any questions that you would like to speak with a pharmacist about [facility], [name], [place]).

## 2019-03-11 ENCOUNTER — Encounter (INDEPENDENT_AMBULATORY_CARE_PROVIDER_SITE_OTHER): Payer: Self-pay

## 2019-03-11 ENCOUNTER — Other Ambulatory Visit: Payer: Self-pay

## 2019-03-11 ENCOUNTER — Ambulatory Visit (INDEPENDENT_AMBULATORY_CARE_PROVIDER_SITE_OTHER): Payer: Self-pay | Admitting: Pharmacist

## 2019-03-11 VITALS — BP 146/88 | HR 66

## 2019-03-11 DIAGNOSIS — T466X5A Adverse effect of antihyperlipidemic and antiarteriosclerotic drugs, initial encounter: Secondary | ICD-10-CM

## 2019-03-11 DIAGNOSIS — I10 Essential (primary) hypertension: Secondary | ICD-10-CM

## 2019-03-11 DIAGNOSIS — Z720 Tobacco use: Secondary | ICD-10-CM

## 2019-03-11 DIAGNOSIS — E782 Mixed hyperlipidemia: Secondary | ICD-10-CM

## 2019-03-11 DIAGNOSIS — G720 Drug-induced myopathy: Secondary | ICD-10-CM

## 2019-03-12 ENCOUNTER — Telehealth: Payer: Self-pay | Admitting: Pharmacist

## 2019-03-12 DIAGNOSIS — E782 Mixed hyperlipidemia: Secondary | ICD-10-CM

## 2019-03-12 DIAGNOSIS — I10 Essential (primary) hypertension: Secondary | ICD-10-CM

## 2019-03-12 LAB — BASIC METABOLIC PANEL
BUN/Creatinine Ratio: 9 (ref 9–23)
BUN: 12 mg/dL (ref 6–24)
CO2: 20 mmol/L (ref 20–29)
Calcium: 9.2 mg/dL (ref 8.7–10.2)
Chloride: 108 mmol/L — ABNORMAL HIGH (ref 96–106)
Creatinine, Ser: 1.29 mg/dL — ABNORMAL HIGH (ref 0.57–1.00)
GFR calc Af Amer: 59 mL/min/{1.73_m2} — ABNORMAL LOW (ref >59)
GFR calc non Af Amer: 51 mL/min/{1.73_m2} — ABNORMAL LOW (ref >59)
Glucose: 84 mg/dL (ref 65–99)
Potassium: 4.5 mmol/L (ref 3.5–5.2)
Sodium: 138 mmol/L (ref 134–144)

## 2019-03-12 MED ORDER — LISINOPRIL 40 MG PO TABS: 40.0000 mg | ORAL_TABLET | Freq: Every day | ORAL | 3 refills | Status: DC

## 2019-03-12 NOTE — Telephone Encounter (Signed)
Called and spoke with pt at 11:10 AM on 03/12/19.  Discussed with pt dosage increase (lisinopril 20 mg daily to 40 mg daily); pt verbalized understanding.   Also, discussed with pt PCSK9 inhibitor therapy and how she will likely require treatment. Provided pt with medication names (Repatha and Praluent) and encouraged pt to look into medications and call with any questions/concerns. Pt is open to trying PCSK9 inhibitor therapy to help prevent ASCVD event, however, would like to see her updated lipid panel after initiation of pravastatin. In the meantime, will look into patient assistance programs to help with affordability (pt has no insurance, household of 6, and annual income of ~$25,000; likely will qualify for [employer]). Plan to f/u with pt on 04/08/19.

## 2019-03-12 NOTE — Addendum Note (Signed)
Addended by: Ellwood Handler on: 03/12/2019 11:31 AM   Modules accepted: Orders

## 2019-03-12 NOTE — Telephone Encounter (Signed)
Called pt at 10:58 AM on 03/12/19 and left HIPAA-compliant VM with instructions to return call to [facility] pharmacist phone number.   Plan to discuss how BMP results were stable therefore pt is safe to increase lisinopril 20 mg daily to 40 mg daily. Discuss with pt that she can double her dose (take two 20 mg tablets) until she finishes her rx and then she can pick up new rx.   Also, would like to discuss PCSK9 inhibitor in the future with pt considering significant cardiac history and inability to take high intensity statins (atorvastatin - myalgias; rosuvastatin - cost).

## 2019-03-26 ENCOUNTER — Encounter: Payer: Self-pay | Admitting: Family Medicine

## 2019-03-26 ENCOUNTER — Other Ambulatory Visit: Payer: Self-pay

## 2019-03-26 ENCOUNTER — Ambulatory Visit: Payer: Self-pay | Attending: Family Medicine | Admitting: Family Medicine

## 2019-03-26 DIAGNOSIS — Z5329 Procedure and treatment not carried out because of patient's decision for other reasons: Secondary | ICD-10-CM

## 2019-03-26 DIAGNOSIS — Z91199 Patient's noncompliance with other medical treatment and regimen due to unspecified reason: Secondary | ICD-10-CM

## 2019-03-26 NOTE — Progress Notes (Signed)
   Virtual Visit via Telephone Note  I connected with@ on 03/26/19 at  1:30 PM EDT by telephone and verified that I am speaking with the correct person using two identifiers.   I discussed the limitations, risks, security and privacy concerns of performing an evaluation and management service by telephone and the availability of in person appointments. I also discussed with the patient that there may be a patient responsible charge related to this service. The patient expressed understanding and agreed to proceed.  Patient Location: Anticipated location-Home Provider Location: [facility] office Others participating in call: None  Unable to contact patient to start telehealth visit after calling x2.  History of Present Illness:    Past Medical History:  Diagnosis Date  . Hypertension   . NSTEMI (non-ST elevated myocardial infarction) ([place]) 12/19/2018    Past Surgical History:  Procedure Laterality Date  . CESAREAN SECTION    . CHOLECYSTECTOMY    . CORONARY STENT INTERVENTION N/A 12/20/2018   Procedure: CORONARY STENT INTERVENTION;  Surgeon: Lorretta Harp, MD;  Location: [place] [facility];  Service: Cardiovascular;  Laterality: N/A;  . LEFT HEART CATH AND CORONARY ANGIOGRAPHY N/A 12/20/2018   Procedure: LEFT HEART CATH AND CORONARY ANGIOGRAPHY;  Surgeon: Lorretta Harp, MD;  Location: [facility];  Service: Cardiovascular;  Laterality: N/A;    Family History  Problem Relation Age of Onset  . Heart attack Mother 31  . Hypertension Mother   . Hypertension Father   . Heart attack Maternal Grandmother 55  . Diabetes Maternal Grandmother   . Hypertension Sister   . Hypertension Paternal Grandmother   . Breast cancer Paternal Grandmother   . Diabetes Paternal Grandmother   . Cervical cancer Paternal Grandmother   . Heart attack Paternal Grandfather 74  . Hypertension Paternal Grandfather     Social History   Tobacco Use  . Smoking status: Light Tobacco Smoker     Packs/day: 0.25    Types: Cigarettes  . Smokeless tobacco: Never Used  . Tobacco comment: down to 4 cigarrettes per day  Substance Use Topics  . Alcohol use: Never    Frequency: Never  . Drug use: Never     No Known Allergies     Observations/Objective: No vital signs or physical exam conducted as visit was done via telephone  Assessment and Plan:   Follow Up Instructions:    I discussed the assessment and treatment plan with the patient. The patient was provided an opportunity to ask questions and all were answered. The patient agreed with the plan and demonstrated an understanding of the instructions.   The patient was advised to call back or seek an in-person evaluation if the symptoms worsen or if the condition fails to improve as anticipated.  I provided 0 minutes of non-face-to-face time during this encounter.   Antony Blackbird, MD

## 2019-03-26 NOTE — Progress Notes (Signed)
Patient verified DOB Patient has taken medicaiton today. Patient has eaten today Patient Patient states home BP's have been 145/82 with increase of lisinopril. Patient is able to step away from home life for any hour or so her BP is 132/68 in the evening.

## 2019-03-26 NOTE — Addendum Note (Signed)
Addended by: Ellwood Handler on: 03/26/2019 02:22 PM   Modules accepted: Orders

## 2019-04-05 NOTE — Progress Notes (Signed)
Patient ID: Elky Funches Wheat                 DOB: 09-Apr-1976                      MRN: [id number]     HPI: TAMESHA ELLERBROCK is a 43 y.o. female referred byDr. Devoria Albe HTN clinic.PMH is significant for HTN, NSTEMI, CAD s/p DES to RCA on 12/20/18.At last PharmD telephone follow up call (03/12/19), lisinopril was increased from 20 mg daily to 40 mg daily. Of note, pt does not have prescription drug coverage, she  takes BP meds at 9:15 AM and 9:15 PM and she checks her BP daily at night.   Patient presents today for follow up appt. She is not experiencing any s/sx of hypotension. She is still experiencing myalgias bilaterally in her calves.   Current HTN meds:lisinopril 40 mg daily,amlodipine 10 mg daily, metoprolol 50 mg BID, spironolactone 25 mg daily Previously tried:atenolol 50 mg BID (age 79-38 - stopped because lost insurance) BP goal:<130/27mmHg  Current HLD Medications: pravastatin 40 mg daily HLD Intolerances: atorvastatin 80 mg daily (myalgias) Risk Factors: HTN, NSTEMI, CAD (s/p DES to RCA), current smoker  LDL goal: <55 mg/dL  Family History:Diabetes in her maternal grandmother and paternal grandmother; Heart attack (age of onset: 21) in her maternal grandmother; Heart attack (age of onset: 74) in her mother; Heart attack (age of onset: 70) in her paternal grandfather; Hypertension in her father, mother, paternal grandfather, paternal grandmother, and sister.  Social History:smoker 1 ppd until event - currently 1 cigarettes per day (has cut down from 2 cigarettes daily!!) -Tried varenicline and bupropion in the past (anger) -Has not tried patch/gum  Diet:typically only eats dinner, occasionally eats lunch but never breakfast. Eats salmon, whole wheat pita bread, frozen green beans Tries to keep sodium under 1000 mg per day Drinks decaffeinated tea and water (used to drink Dr. Reino Kent before cardiac event, doesn't drink anymore)  Exercise:runs errands with her 4 boys  throughout the day, walks through neighborhood for 30 mins per day  Home BP readings (checks every night):  Mostly 130-138s/70 mmHg Only 2 readings that have been ~148 systolic range  Labs: 01/23/19 TC 113 HDL 28 LDL (calc) 55 TG 151; atorvastatin 80 mg daily 12/19/18 Chol 206 HDL 48 LDL (calc) 122 TG 178 VLDL 36; none  Wt Readings from Last 3 Encounters:  01/23/19 183 lb (83 kg)  12/27/18 179 lb (81.2 kg)  12/21/18 181 lb 14.1 oz (82.5 kg)   BP Readings from Last 3 Encounters:  03/11/19 (!) 146/88  02/11/19 140/84  01/23/19 130/82   Pulse Readings from Last 3 Encounters:  03/11/19 66  02/11/19 (!) 59  01/23/19 61    Renal function: CrCl cannot be calculated (Patient's most recent lab result is older than the maximum 21 days allowed.).  Past Medical History:  Diagnosis Date  . Hypertension   . NSTEMI (non-ST elevated myocardial infarction) (HCC) 12/19/2018    Current Outpatient Medications on File Prior to Visit  Medication Sig Dispense Refill  . amLODipine (NORVASC) 10 MG tablet Take 1 tablet (10 mg total) by mouth daily. 180 tablet 3  . aspirin 81 MG chewable tablet Chew 1 tablet (81 mg total) by mouth daily.    Marland Kitchen lisinopril (ZESTRIL) 40 MG tablet Take 1 tablet (40 mg total) by mouth daily. 90 tablet 3  . metoprolol tartrate (LOPRESSOR) 50 MG tablet Take 1 tablet (50 mg total) by mouth  2 (two) times daily. 60 tablet 5  . Multiple Vitamins-Calcium (ONE-A-DAY WOMENS PO) Take 1 tablet by mouth daily with breakfast.    . nitroGLYCERIN (NITROSTAT) 0.4 MG SL tablet Place 1 tablet (0.4 mg total) under the tongue every 5 (five) minutes x 3 doses as needed for chest pain. 25 tablet 3  . pravastatin (PRAVACHOL) 40 MG tablet Take 1 tablet (40 mg total) by mouth every evening. 30 tablet 11  . spironolactone (ALDACTONE) 25 MG tablet Take 1 tablet (25 mg total) by mouth 2 (two) times daily. 30 tablet 5  . ticagrelor (BRILINTA) 90 MG TABS tablet Take 1 tablet (90 mg total) by mouth 2  (two) times daily. 180 tablet 3   No current facility-administered medications on file prior to visit.     No Known Allergies   Assessment/Plan:  1. Hypertension - goal BP <130/80 mmHg; therefore, pt is not at goal.  Initiate HCTZ 12.5 mg (0.5 tablet of 25 mg tablet) daily. [facility] prices for HCTZ 25 mg are listed as followed 30 tablets = $5 90 tablets = $10. Continue lisinopril 40 mg daily,amlodipine 10 mg daily, metoprolol 50 mg BID, spironolactone 25 mg daily. Patient is obtaining BMP today. Will f/u with pt tomorrow regarding lab results.   2. Hyperlipidemia - goal LDL <55 mg/dL; therefore, pt is currently at goal. However, it is important to consider pt's labs were at goal when on atorvastatin, however, was experiencing myalgias so was switched to pravastatin. Pt remains experiencing myalgias. Plan to discontinue pravastatin secondary to myalgias and initiate Repatha 140 mg subQ every 14 days.  Pt has no insurance, household of 6, and annual income of ~$25,000; likely will qualify for [employer]. Plan for f/u lipid appt in 05/2019.   3. Smoking cessation- Pt smokes ~1 cigarettes/day, which is a significant improvement from last visit when pt was smoking 2 cigarettes/day. Congratulated pt for her success!! Plan to continue nonpharm methods of having her "sons nag her" to quit smoking as frequently. F/u with pt at next appt about smoking cessation attempts.   Thank you for involving pharmacy to assist in providing Ms. Costin's care.   Drexel Iha, PharmD PGY2 Ambulatory Care Pharmacy Resident

## 2019-04-05 NOTE — Patient Instructions (Addendum)
It was a pleasure seeing you in clinic today Isabel [name]!  Today the plan is... 1. START taking hydrochlorothiazide (HCTZ) 12.5 mg (1/2 of 25 mg tablet) daily in the morning.   2. Continue  lisinopril 40 mg daily,amlodipine 10 mg daily, metoprolol 50 mg twice daily, and spironolactone 25 mg daily  3. STOP taking pravastatin  4. We will apply for patient assistance through [place] for Repatha administer 140 mg subQ every 14 days. [facility] will follow up with you about the status of the application.     Please call the PharmD clinic at [phone] if you have any questions that you would like to speak with a pharmacist about [facility], [place], [place]).

## 2019-04-08 ENCOUNTER — Encounter: Payer: Self-pay | Admitting: Pharmacist

## 2019-04-08 ENCOUNTER — Other Ambulatory Visit: Payer: Self-pay

## 2019-04-08 ENCOUNTER — Ambulatory Visit (INDEPENDENT_AMBULATORY_CARE_PROVIDER_SITE_OTHER): Payer: Self-pay | Admitting: Pharmacist

## 2019-04-08 VITALS — BP 135/68 | HR 56 | Resp 98

## 2019-04-08 DIAGNOSIS — I10 Essential (primary) hypertension: Secondary | ICD-10-CM

## 2019-04-08 MED ORDER — HYDROCHLOROTHIAZIDE 25 MG PO TABS: 12.5000 mg | ORAL_TABLET | Freq: Every day | ORAL | 3 refills | Status: DC

## 2019-04-09 ENCOUNTER — Telehealth: Payer: Self-pay | Admitting: Pharmacist

## 2019-04-09 DIAGNOSIS — I10 Essential (primary) hypertension: Secondary | ICD-10-CM

## 2019-04-09 LAB — CMP14 + ANION GAP
ALT: 10 IU/L (ref 0–32)
AST: 8 IU/L (ref 0–40)
Albumin/Globulin Ratio: 2 (ref 1.2–2.2)
Albumin: 4.4 g/dL (ref 3.8–4.8)
Alkaline Phosphatase: 91 IU/L (ref 39–117)
Anion Gap: 13 mmol/L (ref 10.0–18.0)
BUN/Creatinine Ratio: 14 (ref 9–23)
BUN: 29 mg/dL — ABNORMAL HIGH (ref 6–24)
Bilirubin Total: 0.2 mg/dL (ref 0.0–1.2)
CO2: 17 mmol/L — ABNORMAL LOW (ref 20–29)
Calcium: 9 mg/dL (ref 8.7–10.2)
Chloride: 108 mmol/L — ABNORMAL HIGH (ref 96–106)
Creatinine, Ser: 2.1 mg/dL — ABNORMAL HIGH (ref 0.57–1.00)
GFR calc Af Amer: 33 mL/min/{1.73_m2} — ABNORMAL LOW (ref >59)
GFR calc non Af Amer: 28 mL/min/{1.73_m2} — ABNORMAL LOW (ref >59)
Globulin, Total: 2.2 g/dL (ref 1.5–4.5)
Glucose: 90 mg/dL (ref 65–99)
Potassium: 5 mmol/L (ref 3.5–5.2)
Sodium: 138 mmol/L (ref 134–144)
Total Protein: 6.6 g/dL (ref 6.0–8.5)

## 2019-04-09 NOTE — Telephone Encounter (Signed)
Called pt on 04/09/2019 at 11:28 AM.  Discussed with pt recent BMP showed an increase in Scr from 1.29 to 2.10. Asked pt if she had been taking NSAIDs, dehydration, and receiving IV contrast. She confirmed she has not taken any NSAIDs or received any IV contrast. She states it is possible she was dehydrated. She thinks she only drank one bottle of water yesterday. Educated pt that increase in Scr may have been due to being dehydrated as well as recent increase in lisinopril from 20 to 40 mg daily. Pt confirms she picked up HCTZ from pharmacy yesterday. Plan for patient to decrease lisinopril from 40 to 20 mg daily, hold initiation of HCTZ, and re-check BMP in 1 week. Scheduled labs and f/u office pharmacy appt with HTN clinic. Pt verbalized understanding.

## 2019-04-10 NOTE — Addendum Note (Signed)
Addended by: Treyson Axel E on: 04/10/2019 08:03 AM   Modules accepted: Orders

## 2019-04-11 ENCOUNTER — Other Ambulatory Visit: Payer: Self-pay | Admitting: Family Medicine

## 2019-04-12 NOTE — Progress Notes (Signed)
Patient ID: [name]                 DOB: [birth year]                      MRN: [id number]     HPI: Isabel [name] a 43 y.o.femalereferred byDr. America Brown HTN clinic.PMH is significant for HTN, NSTEMI, CAD s/p DES to RCA on 12/20/18.At last PharmD telephone follow up call (04/09/19),lisinopril was decreased from 40 mg daily to 20 mg daily due to >30% increase in SCr. Pt was advised to hold initiation of HCTZ.Of note, pt does not have prescription drug coverage. She takes BP meds at 9:15 AM and 9:15 PM and she checks her BP daily at night.   Patient presents today for follow up appt. Pt is not experiencing any s/sx of hypotension. She received lab draw prior to appt. She states she is much more hydrated today than she was when labs were last drawn (drank 8 water bottles today compared to 1 water bottle last time). She is tolerating current HTN medication regimen.  When asked about Medicaid denial letter, pt states she does not think she has that letter and is not sure where it would be.   Current HTN meds:lisinopril20 mg daily,amlodipine 10 mg daily, metoprolol 50 mg BID, spironolactone 25 mg daily Previously tried:atenolol 50 mg BID (age 62-38 - stopped because lost insurance), lisinopril 40 mg daily (>30% increase in Scr) BP goal:<130/66mmHg  CurrentHLDMedications:Repatha 140 mg subQ every 14 days - pending approval for pt assistasnce HLDIntolerances: atorvastatin80 mg daily (myalgias), pravastatin 40 mg daily (myalgias) Risk Factors:HTN,NSTEMI, CAD(s/p DES to RCA), current smoker LDL goal: <55 mg/dL  Family History:Diabetes in her maternal grandmother and paternal grandmother; Heart attack (age of onset: 10) in her maternal grandmother; Heart attack (age of onset: 40) in her mother; Heart attack (age of onset: 32) in her paternal grandfather; Hypertension in her father, mother, paternal grandfather, paternal grandmother, and sister.  Social History:smoker  1 ppd until event - currently1cigarettes per day (has cut down from 2 cigarettes daily!!) -Tried varenicline and bupropion in the past (anger) -Has not tried patch/gum  Diet:typically only eats dinner, occasionally eats lunch but never breakfast. Eats salmon, whole wheat pita bread, frozen green beans Tries to keep sodium under 1000 mg per day Drinks decaffeinated tea and water (used to drink Dr. Malachi Bonds before cardiac event, doesn't drink anymore)  Exercise:runs errands with her 4 boys throughout the day, walks through neighborhood for 30 mins per day  Home BP readings(checks every night): Friday 146/86 mmHg (pulse ~60-65)  Sunday 148/88 mmHg (pulse ~60-65)  Labs: 01/23/19 TC 113 HDL 28 LDL (calc) 55 TG 151; atorvastatin 80 mg daily 12/19/18 Chol 206 HDL 48 LDL (calc) 122 TG 178 VLDL 36; none  Wt Readings from Last 3 Encounters:  01/23/19 183 lb (83 kg)  12/27/18 179 lb (81.2 kg)  12/21/18 181 lb 14.1 oz (82.5 kg)   BP Readings from Last 3 Encounters:  04/08/19 135/68  03/11/19 (!) 146/88  02/11/19 140/84   Pulse Readings from Last 3 Encounters:  04/08/19 (!) 56  03/11/19 66  02/11/19 (!) 59    Renal function: CrCl cannot be calculated (Unknown ideal weight.).  Past Medical History:  Diagnosis Date  . Hypertension   . NSTEMI (non-ST elevated myocardial infarction) ([place]) 12/19/2018    Current Outpatient Medications on File Prior to Visit  Medication Sig Dispense Refill  . amLODipine (NORVASC) 10 MG tablet Take  1 tablet (10 mg total) by mouth daily. 180 tablet 3  . aspirin 81 MG chewable tablet Chew 1 tablet (81 mg total) by mouth daily.    . hydrochlorothiazide (HYDRODIURIL) 25 MG tablet Take 0.5 tablets (12.5 mg total) by mouth daily. 15 tablet 3  . lisinopril (ZESTRIL) 40 MG tablet Take 1 tablet (40 mg total) by mouth daily. 90 tablet 3  . metoprolol tartrate (LOPRESSOR) 50 MG tablet Take 1 tablet (50 mg total) by mouth 2 (two) times daily. 60 tablet 5  .  Multiple Vitamins-Calcium (ONE-A-DAY WOMENS PO) Take 1 tablet by mouth daily with breakfast.    . nitroGLYCERIN (NITROSTAT) 0.4 MG SL tablet Place 1 tablet (0.4 mg total) under the tongue every 5 (five) minutes x 3 doses as needed for chest pain. 25 tablet 3  . pravastatin (PRAVACHOL) 40 MG tablet Take 1 tablet (40 mg total) by mouth every evening. 30 tablet 11  . spironolactone (ALDACTONE) 25 MG tablet Take 1 tablet (25 mg total) by mouth 2 (two) times daily. 30 tablet 5  . ticagrelor (BRILINTA) 90 MG TABS tablet Take 1 tablet (90 mg total) by mouth 2 (two) times daily. 180 tablet 3   No current facility-administered medications on file prior to visit.     No Known Allergies   Assessment/Plan:  1. Hypertension - goal <130/80 mmHg; therefore, pt is not at goal. Plan to continue lisinopril20 mg daily,amlodipine 10 mg daily, metoprolol 50 mg BID, spironolactone 25 mg daily. If Scr/K are stable then continue lisinopril 20 mg daily and switch from metoprolol to carvedilol 12.5 mg BID. If Scr/K are not stable then discontinue lisinopril, switch from metoprolol to carvedilol 12.5 mg BID, and obtain follow up labs in a week. Will follow up with patient tomorrow 04/16/2019.   2. Hyperlipidemia - goal LDL <55 mg/dL; therefore, pt is currently at goal. However, it is important to consider pt's labs were at goal when on atorvastatin, however, was experiencing myalgias so was switched to pravastatin. Pt remained experiencing myalgias on pravastation therefore was discontinued. In the process of applying for [employer] for financial assistance with Repatha 140 mg subQ every 14 days. Will call [employer] on Friday 04/18/2019 for status update on application then will follow up with patient to provide her an update. Informed pt that application may require medication denial letter and she may need to reach out to Medicaid since she does not have her denial letter. Pt verbalized  understanding.   3. Smoking cessation- Pt smokes ~1cigarettes/day, which is a significant improvement from last visit when pt was smoking 2 cigarettes/day. Congratulated pt for her success!! Plan to continue nonpharm methods of having her "sons nag her" to quit smoking as frequently. F/u with pt at next appt about smoking cessation attempts.  Thank you for involving pharmacy to assist in providing Ms. Winbush's care.   Zachery Conch, PharmD PGY2 Ambulatory Care Pharmacy Resident

## 2019-04-15 ENCOUNTER — Ambulatory Visit (INDEPENDENT_AMBULATORY_CARE_PROVIDER_SITE_OTHER): Payer: Self-pay | Admitting: Pharmacist

## 2019-04-15 ENCOUNTER — Other Ambulatory Visit: Payer: Self-pay

## 2019-04-15 VITALS — BP 142/80 | HR 62

## 2019-04-15 DIAGNOSIS — E782 Mixed hyperlipidemia: Secondary | ICD-10-CM

## 2019-04-15 DIAGNOSIS — I10 Essential (primary) hypertension: Secondary | ICD-10-CM

## 2019-04-15 DIAGNOSIS — Z720 Tobacco use: Secondary | ICD-10-CM

## 2019-04-15 NOTE — Patient Instructions (Addendum)
It was a pleasure seeing you in clinic today Mrs. Isabel [name]!  Today the plan is... 1. Continue lisinopril20 mg daily,amlodipine 10 mg daily, metoprolol 50 mg BID, spironolactone 25 mg daily  2. I will call you tomorrow with your lab results and we will plan to make a medication change then  3. I will call you Friday as well to give you a status update about your Repatha  4. Continue your success with smoking cessation!!! :)  Please call the PharmD clinic at [phone] if you have any questions that you would like to speak with a pharmacist about [facility], [place], [place] Salinas).

## 2019-04-16 ENCOUNTER — Telehealth: Payer: Self-pay | Admitting: Pharmacist

## 2019-04-16 DIAGNOSIS — I10 Essential (primary) hypertension: Secondary | ICD-10-CM

## 2019-04-16 LAB — BASIC METABOLIC PANEL
BUN/Creatinine Ratio: 12 (ref 9–23)
BUN: 19 mg/dL (ref 6–24)
CO2: 16 mmol/L — ABNORMAL LOW (ref 20–29)
Calcium: 8.6 mg/dL — ABNORMAL LOW (ref 8.7–10.2)
Chloride: 108 mmol/L — ABNORMAL HIGH (ref 96–106)
Creatinine, Ser: 1.59 mg/dL — ABNORMAL HIGH (ref 0.57–1.00)
GFR calc Af Amer: 46 mL/min/{1.73_m2} — ABNORMAL LOW (ref >59)
GFR calc non Af Amer: 40 mL/min/{1.73_m2} — ABNORMAL LOW (ref >59)
Glucose: 104 mg/dL — ABNORMAL HIGH (ref 65–99)
Potassium: 4.9 mmol/L (ref 3.5–5.2)
Sodium: 135 mmol/L (ref 134–144)

## 2019-04-16 MED ORDER — CARVEDILOL 12.5 MG PO TABS: 12.5000 mg | ORAL_TABLET | Freq: Two times a day (BID) | ORAL | 3 refills | Status: DC

## 2019-04-16 NOTE — Addendum Note (Signed)
Addended by: Ellwood Handler on: 04/16/2019 03:36 PM   Modules accepted: Orders

## 2019-04-16 NOTE — Telephone Encounter (Signed)
Called pt on 04/16/2019 at 1:21PM.  Informed pt that her recent BMP (04/15/19) showed that Scr has decreased from 2.10 to 1.59, however remains elevated. K remains stable.  Plan to switch metoprolol 50 mg BID to carvedilol 12.5 mg BID. Continue lisinopril20 mg daily,amlodipine 10 mg daily, spironolactone 25 mg daily. Plan follow up BMP and office visit for 05/02/2019. Pt verbalized understanding.   Drexel Iha, PharmD PGY2 Ambulatory Care Pharmacy Resident

## 2019-04-16 NOTE — Telephone Encounter (Signed)
Called pt on 04/16/2019 at 10:28AM and left HIPAA-compliant VM with instructions to call [facility] back   Plan to discuss recent lab draw results, switching from metoprolol BID to carvedilol BID, and scheduling a future lab drawn/office visit in ~2 weeks.  Will follow up with patient again this afternoon (04/16/2019).  Drexel Iha, PharmD PGY2 Ambulatory Care Pharmacy Resident

## 2019-04-18 ENCOUNTER — Telehealth: Payer: Self-pay | Admitting: Pharmacist

## 2019-04-18 NOTE — Telephone Encounter (Signed)
Called pt on 04/18/2019 at 3:11 PM.  Informed her she has been approved for a $0 copay for Repatha from 04/18/2019 to 04/17/2020 through the [employer] (case number [id number]). Informed pt that an Event organiser will reach out to schedule shipment. Pt verbalized understanding.  Pt confirms she has started carvedilol 12.5 mg BID and stopped taking metoprolol. She is tolerating carvedilol and is not experiencing side effects. She is not experiencing s/sx of hypotension.  Will follow up with pt for an office visit with HTN clinic on 05/02/2019.

## 2019-05-02 ENCOUNTER — Other Ambulatory Visit: Payer: Self-pay

## 2019-05-02 ENCOUNTER — Ambulatory Visit (INDEPENDENT_AMBULATORY_CARE_PROVIDER_SITE_OTHER): Payer: Self-pay | Admitting: Pharmacist

## 2019-05-02 ENCOUNTER — Other Ambulatory Visit: Payer: Self-pay | Admitting: *Deleted

## 2019-05-02 ENCOUNTER — Encounter (INDEPENDENT_AMBULATORY_CARE_PROVIDER_SITE_OTHER): Payer: Self-pay

## 2019-05-02 VITALS — BP 110/76 | HR 93 | Ht 65.0 in | Wt 187.0 lb

## 2019-05-02 DIAGNOSIS — E782 Mixed hyperlipidemia: Secondary | ICD-10-CM

## 2019-05-02 DIAGNOSIS — T466X5A Adverse effect of antihyperlipidemic and antiarteriosclerotic drugs, initial encounter: Secondary | ICD-10-CM

## 2019-05-02 DIAGNOSIS — I10 Essential (primary) hypertension: Secondary | ICD-10-CM

## 2019-05-02 DIAGNOSIS — G720 Drug-induced myopathy: Secondary | ICD-10-CM

## 2019-05-02 LAB — BASIC METABOLIC PANEL
BUN/Creatinine Ratio: 9 (ref 9–23)
BUN: 18 mg/dL (ref 6–24)
CO2: 16 mmol/L — ABNORMAL LOW (ref 20–29)
Calcium: 8.9 mg/dL (ref 8.7–10.2)
Chloride: 109 mmol/L — ABNORMAL HIGH (ref 96–106)
Creatinine, Ser: 1.91 mg/dL — ABNORMAL HIGH (ref 0.57–1.00)
GFR calc Af Amer: 37 mL/min/{1.73_m2} — ABNORMAL LOW (ref >59)
GFR calc non Af Amer: 32 mL/min/{1.73_m2} — ABNORMAL LOW (ref >59)
Glucose: 89 mg/dL (ref 65–99)
Potassium: 4.9 mmol/L (ref 3.5–5.2)
Sodium: 138 mmol/L (ref 134–144)

## 2019-05-02 LAB — LIPID PANEL
Chol/HDL Ratio: 7.3 ratio — ABNORMAL HIGH (ref 0.0–4.4)
Cholesterol, Total: 198 mg/dL (ref 100–199)
HDL: 27 mg/dL — ABNORMAL LOW (ref >39)
LDL Chol Calc (NIH): 122 mg/dL — ABNORMAL HIGH (ref 0–99)
Triglycerides: 278 mg/dL — ABNORMAL HIGH (ref 0–149)
VLDL Cholesterol Cal: 49 mg/dL — ABNORMAL HIGH (ref 5–40)

## 2019-05-02 MED ORDER — LISINOPRIL 20 MG PO TABS: 20.0000 mg | ORAL_TABLET | Freq: Every day | ORAL | 3 refills | Status: DC

## 2019-05-02 NOTE — Patient Instructions (Addendum)
Cholesterol:  Call [facility] to coordinate shipping [place] shots to your home: [phone]  Follow up with fasting cholesterol labs on Friday, January 29th any time after 7:30am  Blood pressure:  Your pressure is excellent today! Your goal is < 130/33mmHg  Continue taking your current medications for your blood pressure: Lisinopril 20mg  daily Amlodipine 10mg  daily Carvedilol 12.5mg  twice daily Spironolactone 25mg  daily  Continue to monitor your blood pressure at home and call clinic if your readings stay consistently elevated above goal

## 2019-05-02 NOTE — Progress Notes (Signed)
Patient ID: Isabel [name]                 DOB: [birth year]                      MRN: [id number]     HPI: Emali Heyward Sizemoreis a 43 y.o.femalereferred byDr. America Brown HTN clinic.PMH is significant for HTN, NSTEMI, CAD s/p DES to RCA on 12/20/18.At last PharmD visit (11/17), metoprolol was changed to carvedilol for better BP lowering.Of note, pt does not have prescription drug coverage. She takes BP meds at 9:15 AM and 9:15 PM and she checks her BP daily at night.   Pt presents today in good spirits. She has cut back from smoking 1 cigarette per day to 2 cigarettes per week. She took her AM BP medications and has not had any caffeine today. BP is much improved today - pt reports readings typically look better in clinic since she is less stressed (has 4 young boys at home). Also states she has had her home BP cuff for many years. She is tolerating her BP medications well and denies dizziness. Has had more stress the past few days - HR is higher today than normal. Has been staying hydrated with water in anticipation of labs being drawn today.  Pt states she has not started Repatha yet - has not heard from [employer]. She stopped her pravastatin 4 weeks ago and her myalgias have resolved. Similar to previous myalgias with atorvastatin. Currently not on any lipid lowering therapy.  Current HTN meds:lisinopril20 mg daily (8:15pm),amlodipine 10 mg daily (8:15pm), carvedilol 12.51m BID, spironolactone 25 mg daily (8:15am) Previously tried:atenolol 50 mg BID (age 43-38- stopped because lost insurance), lisinopril 40 mg daily (>30% increase in Scr after increasing dose from 239m BP goal:<130/8016m  CurrentHLDMedications:Repatha 140 mg subQ every 14 days - has not started yet HLDIntolerances: atorvastatin80 mg daily (myalgias), pravastatin 40 mg daily (myalgias) Risk Factors:HTN,NSTEMI, CAD(s/p DES to RCA), current smoker LDL goal: <55 mg/dL  Family History:Diabetes in her  maternal grandmother and paternal grandmother; Heart attack (age of onset: 55)10n her maternal grandmother; Heart attack (age of onset: 64)11n her mother; Heart attack (age of onset: 74)41n her paternal grandfather; Hypertension in her father, mother, paternal grandfather, paternal grandmother, and sister.  Social History:smoker 1 ppd until event - currently1cigarettes per day (has cut down from 2 cigarettes daily!!) -Tried varenicline and bupropion in the past (anger) -Has not tried patch/gum  Diet:typically only eats dinner, occasionally eats lunch but never breakfast. Eats salmon, whole wheat pita bread, frozen green beans Tries to keep sodium under 1000 mg per day Drinks decaffeinated tea and water (used to drink Dr. PepMalachi Bondsfore cardiac event, doesn't drink anymore)  Exercise:runs errands with her 4 boys throughout the day, walks through neighborhood for 30 mins per day  Home BP readings(checks every night): 140-150/80-90s  Labs: 01/23/19 TC 113 HDL 28 LDL (calc) 55 TG 151; atorvastatin 80 mg daily 12/19/18 Chol 206 HDL 48 LDL (calc) 122 TG 178 VLDL 36; none  Wt Readings from Last 3 Encounters:  01/23/19 183 lb (83 kg)  12/27/18 179 lb (81.2 kg)  12/21/18 181 lb 14.1 oz (82.5 kg)   BP Readings from Last 3 Encounters:  04/15/19 (!) 142/80  04/08/19 135/68  03/11/19 (!) 146/88   Pulse Readings from Last 3 Encounters:  04/15/19 62  04/08/19 (!) 56  03/11/19 66    Renal function: CrCl cannot be calculated (Unknown  ideal weight.).  Past Medical History:  Diagnosis Date  . Hypertension   . NSTEMI (non-ST elevated myocardial infarction) ([place]) 12/19/2018    Current Outpatient Medications on File Prior to Visit  Medication Sig Dispense Refill  . amLODipine (NORVASC) 10 MG tablet Take 1 tablet (10 mg total) by mouth daily. 180 tablet 3  . aspirin 81 MG chewable tablet Chew 1 tablet (81 mg total) by mouth daily.    . carvedilol (COREG) 12.5 MG tablet Take 1 tablet  (12.5 mg total) by mouth 2 (two) times daily. 60 tablet 3  . Evolocumab (REPATHA SURECLICK) 409 MG/ML SOAJ Inject 140 mg into the skin every 14 (fourteen) days.    Marland Kitchen lisinopril (ZESTRIL) 40 MG tablet Take 1 tablet (40 mg total) by mouth daily. 90 tablet 3  . Multiple Vitamins-Calcium (ONE-A-DAY WOMENS PO) Take 1 tablet by mouth daily with breakfast.    . nitroGLYCERIN (NITROSTAT) 0.4 MG SL tablet Place 1 tablet (0.4 mg total) under the tongue every 5 (five) minutes x 3 doses as needed for chest pain. 25 tablet 3  . pravastatin (PRAVACHOL) 40 MG tablet Take 1 tablet (40 mg total) by mouth every evening. 30 tablet 11  . spironolactone (ALDACTONE) 25 MG tablet Take 1 tablet (25 mg total) by mouth 2 (two) times daily. 30 tablet 5  . ticagrelor (BRILINTA) 90 MG TABS tablet Take 1 tablet (90 mg total) by mouth 2 (two) times daily. 180 tablet 3   No current facility-administered medications on file prior to visit.     No Known Allergies   Assessment/Plan:  1. Hypertension - BP is much improved and now at goal <130/80 mmHg. Plan to continue lisinopril20 mg daily,amlodipine 10 mg daily, carvedilol 12.58m BID, spironolactone 25 mg daily. Rechecking BMET today. Pt aware to continue monitoring BP at home. F/u in HTN clinic as needed.  2. Hyperlipidemia - LDL goal < 554mdL. Pt is intolerant to 2 statins and was most recently approved for Repatha 14027m2W through the [employer] since she is uninsured. Checking baseline lipid panel on no lipid lowering therapy today. She has not received her shipment of RepStanhopet - provided her with their # to call and coordinate this. Moved f/u lab work to the end of January to assess efficacy. Pt also signed informed consent for [employer]US lipid registry.  3. Smoking cessation- Pt continues to cut back on tobacco use - now smoking only 2 cigarettes per week. Congratulated pt on her success and encouraged her to continue moving towards complete tobacco  cessation.  Toneshia Coello E. Vianey Caniglia, PharmD, BCACP, [id number] Chu[address] Foxrun StreetrePort AllenC [id number]: [phone]: ([phone]/08/2018 7:29 AM   [employer] Evolocumab [id number] No: [id number] ID No: [id number]     Did subject meet all eligibility criteria?  Code  No Yes  101110dults age ? 40 years _0  _1   One or both of the following:   102 A  Hospitalization for a clinical ASCVD event: acute (MI), UA, IS, or CLI within 18 months of enrollment        Note: Subjects must have been admitted to the hospital. Those       who are admitted and discharged in less than 24 hours are      Eligible for the study.Subjects who have been admitted to     The ER for a clinical ASCVD. _2  _3   102B Coronary or peripheral revascularization including  percutaneous or surgical revascularization in the past 18 months _0  _1   One of the following:  103 A  LDL ? 70 mg/dL (1.81 mmol/L) with no plans for immediate initiation or titration of statin therapy _2  _3   103 B Newly started on PCSK9i after the index hospitalization/procedure and prior to enrollment (but no more than 6 months prior to enrollment) with pre-PCSK9i treatment LDL-C value available and measured within 6 months of starting PCSK9i and known background LLT any time prior to PCSK9i initiation. _4  _5   105 Planned follow-up within the health system _6  _7   Subject does not meet any of the following exclusion criteria   201 Unable or unwilling to provide informed consent, including but not limited to cognitive or language barriers (reading or comprehension) _8  _9   202 Lack of phone or email for contact _10  _11   203 Evidence of end stage renal disease (ESRD) or stage 5 CKD _12  _13   204 Anticipated life expectancy less than 6 month  _14  _15   206  On a PCSK9i prior to their qualifying event _16  _17      Subject Name: Godfrey  Subject met inclusion and exclusion criteria.  The informed consent form,  study requirements and expectations were reviewed with the subject and questions and concerns were addressed prior to the signing of the consent form.  The subject verbalized understanding of the trial requirements.  The subject agreed to participate in the [facility] trial and signed the informed consent at [facility] on12/04/20.  The informed consent was obtained prior to performance of any protocol-specific procedures for the subject.  A copy of the signed informed consent was given to the subject and a copy was placed in the subject's medical record.   Gregorey Nabor   Socioeconomic Status - Baseline  Education Some High School or Less High School graduate Some College/University Graduated College/University or above _18  _19  _20  _21    Marital Status Married Single Divorced Separated _22  _23  _24  _25    Income <$15,000 $15,001 - $34,999 $35,000 - $74,999 $75,000 - $99,999 >$100,000 _26  _27  _28  _29  _30

## 2019-05-09 ENCOUNTER — Telehealth: Payer: Self-pay | Admitting: Pharmacist

## 2019-05-09 DIAGNOSIS — I10 Essential (primary) hypertension: Secondary | ICD-10-CM

## 2019-05-09 NOTE — Telephone Encounter (Signed)
Patient called concerned over her scr increasing again. It decreased after reducing lisinopril to 20mg  daily. However is back up again. Will decrease to 10mg  daily and recheck BMP in 1 week

## 2019-05-13 ENCOUNTER — Other Ambulatory Visit: Payer: Self-pay

## 2019-05-15 ENCOUNTER — Encounter: Payer: Self-pay | Admitting: Cardiology

## 2019-05-16 ENCOUNTER — Other Ambulatory Visit: Payer: Self-pay

## 2019-05-21 ENCOUNTER — Other Ambulatory Visit: Payer: Self-pay

## 2019-06-03 ENCOUNTER — Other Ambulatory Visit: Payer: Self-pay

## 2019-06-15 ENCOUNTER — Other Ambulatory Visit: Payer: Self-pay | Admitting: Physician Assistant

## 2019-06-17 NOTE — Telephone Encounter (Signed)
Ok to fill.  Please place note on prescription to call and make an appt.  Last seen 11/2018 and was told to f/u in 6 months.

## 2019-06-27 ENCOUNTER — Other Ambulatory Visit: Payer: Self-pay

## 2019-06-30 ENCOUNTER — Telehealth: Payer: Self-pay | Admitting: Pharmacist

## 2019-06-30 NOTE — Telephone Encounter (Signed)
Called patient on 06/30/2019 at 4:51 PM and left HIPAA-compliant VM with instructions to call [facility] back   It appears patient never obtained follow up BMET to assess Scr and K. Plan to discuss re-scheduling labs and re-assessing BP management / toleration of lisinopril 10 mg (reduction made by Dr. Roselind Messier on 05/09/2019).  Thank you for involving pharmacy to assist in providing this patient's care.   Zachery Conch, PharmD PGY2 Ambulatory Care Pharmacy Resident

## 2019-07-07 ENCOUNTER — Telehealth: Payer: Self-pay | Admitting: Pharmacist

## 2019-07-07 NOTE — Telephone Encounter (Signed)
Called patient on 07/07/2019 at 3:42 PM and left HIPAA-compliant VM with instructions to call [facility] back   It appears patient never obtained follow up BMET to assess Scr and K. Plan to discuss re-scheduling labs and re-assessing BP management / toleration of lisinopril 10 mg (reduction made by Dr. Roselind Messier on 05/09/2019).  Thank you for involving pharmacy to assist in providing this patient's care.   Zachery Conch, PharmD PGY2 Ambulatory Care Pharmacy Resident

## 2019-07-17 ENCOUNTER — Telehealth: Payer: Self-pay | Admitting: Pharmacist

## 2019-07-17 NOTE — Telephone Encounter (Signed)
Called patient on 07/17/2019 at 1:43 PM and left HIPAA-compliant VM with instructions to call [facility] clinic back   It appears patient never obtained follow up BMET to assess Scr and K. Plan to discuss re-scheduling labs and re-assessing BP management / toleration of lisinopril 10 mg (reduction made by Dr. Roselind Messier on 05/09/2019).  Third unsuccessful attempt to reach patient. Will await patient to return call at this time.   Thank you for involving pharmacy to assist in providing this patient's care.   Zachery Conch, PharmD PGY2 Ambulatory Care Pharmacy Resident

## 2019-10-26 ENCOUNTER — Emergency Department (HOSPITAL_COMMUNITY): Payer: Medicaid Other

## 2019-10-26 ENCOUNTER — Other Ambulatory Visit: Payer: Self-pay

## 2019-10-26 ENCOUNTER — Ambulatory Visit (HOSPITAL_COMMUNITY)
Admission: EM | Admit: 2019-10-26 | Discharge: 2019-10-26 | Payer: Self-pay | Attending: Family Medicine | Admitting: Family Medicine

## 2019-10-26 ENCOUNTER — Inpatient Hospital Stay (HOSPITAL_COMMUNITY)
Admission: EM | Admit: 2019-10-26 | Discharge: 2019-10-29 | DRG: 683 | Disposition: A | Discharge status: Home / Self Care | Payer: Medicaid Other | Attending: Internal Medicine | Admitting: Internal Medicine

## 2019-10-26 ENCOUNTER — Encounter (HOSPITAL_COMMUNITY): Payer: Self-pay | Admitting: Emergency Medicine

## 2019-10-26 DIAGNOSIS — Z9049 Acquired absence of other specified parts of digestive tract: Secondary | ICD-10-CM

## 2019-10-26 DIAGNOSIS — I129 Hypertensive chronic kidney disease with stage 1 through stage 4 chronic kidney disease, or unspecified chronic kidney disease: Secondary | ICD-10-CM | POA: Diagnosis present

## 2019-10-26 DIAGNOSIS — D638 Anemia in other chronic diseases classified elsewhere: Secondary | ICD-10-CM | POA: Diagnosis present

## 2019-10-26 DIAGNOSIS — F17210 Nicotine dependence, cigarettes, uncomplicated: Secondary | ICD-10-CM | POA: Diagnosis present

## 2019-10-26 DIAGNOSIS — K122 Cellulitis and abscess of mouth: Secondary | ICD-10-CM | POA: Diagnosis present

## 2019-10-26 DIAGNOSIS — K047 Periapical abscess without sinus: Secondary | ICD-10-CM | POA: Diagnosis present

## 2019-10-26 DIAGNOSIS — I959 Hypotension, unspecified: Secondary | ICD-10-CM

## 2019-10-26 DIAGNOSIS — E872 Acidosis: Secondary | ICD-10-CM | POA: Diagnosis present

## 2019-10-26 DIAGNOSIS — D539 Nutritional anemia, unspecified: Secondary | ICD-10-CM | POA: Diagnosis present

## 2019-10-26 DIAGNOSIS — N183 Chronic kidney disease, stage 3 unspecified: Secondary | ICD-10-CM | POA: Diagnosis present

## 2019-10-26 DIAGNOSIS — E860 Dehydration: Secondary | ICD-10-CM | POA: Diagnosis present

## 2019-10-26 DIAGNOSIS — I252 Old myocardial infarction: Secondary | ICD-10-CM | POA: Diagnosis not present

## 2019-10-26 DIAGNOSIS — Z20822 Contact with and (suspected) exposure to covid-19: Secondary | ICD-10-CM | POA: Diagnosis present

## 2019-10-26 DIAGNOSIS — K529 Noninfective gastroenteritis and colitis, unspecified: Secondary | ICD-10-CM | POA: Diagnosis present

## 2019-10-26 DIAGNOSIS — I700 Atherosclerosis of aorta: Secondary | ICD-10-CM | POA: Diagnosis present

## 2019-10-26 DIAGNOSIS — Z79899 Other long term (current) drug therapy: Secondary | ICD-10-CM | POA: Diagnosis not present

## 2019-10-26 DIAGNOSIS — E785 Hyperlipidemia, unspecified: Secondary | ICD-10-CM | POA: Diagnosis present

## 2019-10-26 DIAGNOSIS — Z7982 Long term (current) use of aspirin: Secondary | ICD-10-CM | POA: Diagnosis not present

## 2019-10-26 DIAGNOSIS — R112 Nausea with vomiting, unspecified: Secondary | ICD-10-CM

## 2019-10-26 DIAGNOSIS — Z8049 Family history of malignant neoplasm of other genital organs: Secondary | ICD-10-CM | POA: Diagnosis not present

## 2019-10-26 DIAGNOSIS — Z833 Family history of diabetes mellitus: Secondary | ICD-10-CM

## 2019-10-26 DIAGNOSIS — Z7902 Long term (current) use of antithrombotics/antiplatelets: Secondary | ICD-10-CM | POA: Diagnosis not present

## 2019-10-26 DIAGNOSIS — Z8249 Family history of ischemic heart disease and other diseases of the circulatory system: Secondary | ICD-10-CM | POA: Diagnosis not present

## 2019-10-26 DIAGNOSIS — R000 Tachycardia, unspecified: Secondary | ICD-10-CM

## 2019-10-26 DIAGNOSIS — R197 Diarrhea, unspecified: Secondary | ICD-10-CM

## 2019-10-26 DIAGNOSIS — R630 Anorexia: Secondary | ICD-10-CM | POA: Diagnosis present

## 2019-10-26 DIAGNOSIS — I10 Essential (primary) hypertension: Secondary | ICD-10-CM | POA: Diagnosis present

## 2019-10-26 DIAGNOSIS — Z955 Presence of coronary angioplasty implant and graft: Secondary | ICD-10-CM

## 2019-10-26 DIAGNOSIS — N179 Acute kidney failure, unspecified: Secondary | ICD-10-CM | POA: Diagnosis present

## 2019-10-26 DIAGNOSIS — K921 Melena: Secondary | ICD-10-CM

## 2019-10-26 DIAGNOSIS — Z803 Family history of malignant neoplasm of breast: Secondary | ICD-10-CM

## 2019-10-26 DIAGNOSIS — I2510 Atherosclerotic heart disease of native coronary artery without angina pectoris: Secondary | ICD-10-CM | POA: Diagnosis present

## 2019-10-26 DIAGNOSIS — D649 Anemia, unspecified: Secondary | ICD-10-CM

## 2019-10-26 DIAGNOSIS — R5383 Other fatigue: Secondary | ICD-10-CM

## 2019-10-26 LAB — CBC
HCT: 34.3 % — ABNORMAL LOW (ref 36.0–46.0)
Hemoglobin: 10.9 g/dL — ABNORMAL LOW (ref 12.0–15.0)
MCH: 32.2 pg (ref 26.0–34.0)
MCHC: 31.8 g/dL (ref 30.0–36.0)
MCV: 101.5 fL — ABNORMAL HIGH (ref 80.0–100.0)
Platelets: 365 10*3/uL (ref 150–400)
RBC: 3.38 MIL/uL — ABNORMAL LOW (ref 3.87–5.11)
RDW: 12.7 % (ref 11.5–15.5)
WBC: 17.8 10*3/uL — ABNORMAL HIGH (ref 4.0–10.5)
nRBC: 0 % (ref 0.0–0.2)

## 2019-10-26 LAB — COMPREHENSIVE METABOLIC PANEL
ALT: 10 U/L (ref 0–44)
AST: 8 U/L — ABNORMAL LOW (ref 15–41)
Albumin: 3.5 g/dL (ref 3.5–5.0)
Alkaline Phosphatase: 80 U/L (ref 38–126)
Anion gap: 13 (ref 5–15)
BUN: 59 mg/dL — ABNORMAL HIGH (ref 6–20)
CO2: 11 mmol/L — ABNORMAL LOW (ref 22–32)
Calcium: 8.6 mg/dL — ABNORMAL LOW (ref 8.9–10.3)
Chloride: 114 mmol/L — ABNORMAL HIGH (ref 98–111)
Creatinine, Ser: 6.84 mg/dL — ABNORMAL HIGH (ref 0.44–1.00)
GFR calc Af Amer: 8 mL/min — ABNORMAL LOW (ref >60)
GFR calc non Af Amer: 7 mL/min — ABNORMAL LOW (ref >60)
Glucose, Bld: 122 mg/dL — ABNORMAL HIGH (ref 70–99)
Potassium: 4.5 mmol/L (ref 3.5–5.1)
Sodium: 138 mmol/L (ref 135–145)
Total Bilirubin: <0.1 mg/dL — ABNORMAL LOW (ref 0.3–1.2)
Total Protein: 7 g/dL (ref 6.5–8.1)

## 2019-10-26 LAB — URINALYSIS, ROUTINE W REFLEX MICROSCOPIC
Bilirubin Urine: NEGATIVE
Glucose, UA: NEGATIVE mg/dL
Ketones, ur: NEGATIVE mg/dL
Nitrite: NEGATIVE
Protein, ur: 100 mg/dL — AB
Specific Gravity, Urine: 1.017 (ref 1.005–1.030)
pH: 5 (ref 5.0–8.0)

## 2019-10-26 LAB — I-STAT BETA HCG BLOOD, ED (MC, WL, AP ONLY): I-stat hCG, quantitative: <5 m[IU]/mL (ref <5)

## 2019-10-26 LAB — OSMOLALITY, URINE: Osmolality, Ur: 340 mOsm/kg (ref 300–900)

## 2019-10-26 LAB — SODIUM, URINE, RANDOM: Sodium, Ur: 57 mmol/L

## 2019-10-26 LAB — TYPE AND SCREEN
ABO/RH(D): A POS
Antibody Screen: NEGATIVE

## 2019-10-26 LAB — POC OCCULT BLOOD, ED: Fecal Occult Bld: NEGATIVE

## 2019-10-26 LAB — ABO/RH: ABO/RH(D): A POS

## 2019-10-26 LAB — LIPASE, BLOOD: Lipase: 62 U/L — ABNORMAL HIGH (ref 11–51)

## 2019-10-26 LAB — SARS CORONAVIRUS 2 BY RT PCR (HOSPITAL ORDER, PERFORMED IN ~~LOC~~ HOSPITAL LAB): SARS Coronavirus 2: NEGATIVE

## 2019-10-26 MED ORDER — SODIUM CHLORIDE 0.9 % IV SOLN: 1000.0000 mL | INTRAVENOUS | Status: DC

## 2019-10-26 MED ORDER — LACTATED RINGERS IV SOLN: INTRAVENOUS | Status: DC

## 2019-10-26 MED ORDER — SODIUM CHLORIDE 0.9 % IV SOLN: Freq: Once | INTRAVENOUS | Status: AC

## 2019-10-26 MED ORDER — TICAGRELOR 90 MG PO TABS
90.0000 mg | ORAL_TABLET | Freq: Two times a day (BID) | ORAL | Status: DC
  Administered 2019-10-26 – 2019-10-29 (×6): 90 mg via ORAL
  Filled 2019-10-26 (×6): qty 1

## 2019-10-26 MED ORDER — LACTATED RINGERS IV BOLUS
500.0000 mL | Freq: Once | INTRAVENOUS | Status: AC
  Administered 2019-10-26: 500 mL via INTRAVENOUS

## 2019-10-26 MED ORDER — ACETAMINOPHEN 325 MG PO TABS
650.0000 mg | ORAL_TABLET | Freq: Four times a day (QID) | ORAL | Status: DC | PRN
  Administered 2019-10-27: 650 mg via ORAL
  Filled 2019-10-26 (×2): qty 2

## 2019-10-26 MED ORDER — LACTATED RINGERS IV BOLUS: 500.0000 mL | Freq: Once | INTRAVENOUS | Status: AC

## 2019-10-26 MED ORDER — SODIUM CHLORIDE 0.9% FLUSH
3.0000 mL | Freq: Once | INTRAVENOUS | Status: AC
  Administered 2019-10-26: 3 mL via INTRAVENOUS

## 2019-10-26 MED ORDER — CARVEDILOL 12.5 MG PO TABS
12.5000 mg | ORAL_TABLET | Freq: Two times a day (BID) | ORAL | Status: DC
  Administered 2019-10-26 – 2019-10-29 (×6): 12.5 mg via ORAL
  Filled 2019-10-26 (×6): qty 1

## 2019-10-26 MED ORDER — EVOLOCUMAB 140 MG/ML ~~LOC~~ SOAJ
140.0000 mg | SUBCUTANEOUS | Status: DC
  Administered 2019-10-28: 140 mg via SUBCUTANEOUS
  Filled 2019-10-26 (×2): qty 1

## 2019-10-26 MED ORDER — SODIUM CHLORIDE 0.9 % IV BOLUS (SEPSIS)
2000.0000 mL | Freq: Once | INTRAVENOUS | Status: AC
  Administered 2019-10-26: 2000 mL via INTRAVENOUS

## 2019-10-26 MED ORDER — PANTOPRAZOLE SODIUM 40 MG PO TBEC
40.0000 mg | DELAYED_RELEASE_TABLET | Freq: Two times a day (BID) | ORAL | Status: DC
  Administered 2019-10-26 – 2019-10-27 (×2): 40 mg via ORAL
  Filled 2019-10-26 (×2): qty 1

## 2019-10-26 MED ORDER — ACETAMINOPHEN 650 MG RE SUPP: 650.0000 mg | Freq: Four times a day (QID) | RECTAL | Status: DC | PRN

## 2019-10-26 MED ORDER — EVOLOCUMAB 140 MG/ML ~~LOC~~ SOAJ: 140.0000 mg | SUBCUTANEOUS | Status: DC

## 2019-10-26 NOTE — ED Triage Notes (Addendum)
Pt to triage via [facility] from [facility].  C/o generalized abd pain (LLQ worse), coffee ground emesis, dark stools, nausea, and fatigue x 1 week.  Per EMS, pt had orthostatic changes, hypotension.  Lying 112/94 HR 94 and Sitting 94/40 HR 120. Received 400cc NS bolus and Zofran 4mg  IV PTA.  NS continues to infuse at this time.

## 2019-10-26 NOTE — ED Provider Notes (Signed)
[facility] EMERGENCY DEPARTMENT Provider Note   CSN: [id number] Arrival date & time: 10/26/19  1604     History Chief Complaint  Patient presents with  . Abdominal Pain    Isabel [name] is a 44 y.o. female.  HPI   Pt started having trouble last week.  She began with vomiting, diarrhea and llq pain.  Pt has not been able to take her medications and has not been able to eat in the last couple of days.  No fevers.  The vomiting and diarrhea have been dark.  Pt has vomited 9 times in the last day or so.  15 episode of diarrhea.  Decreased urine output.  Past Medical History:  Diagnosis Date  . CAD (coronary artery disease)   . Hyperlipidemia LDL goal <70   . Hypertension   . NSTEMI (non-ST elevated myocardial infarction) (HCC) 12/19/2018  . Tobacco abuse     Patient Active Problem List   Diagnosis Date Noted  . 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 Tobacco abuse 01/20/2019  . CAD (coronary artery disease) 12/21/2018  . Hyperlipidemia 12/21/2018  . Hypokalemia 12/21/2018  . NSTEMI (non-ST elevated myocardial infarction) (HCC) 12/19/2018  . Hypertension     Past Surgical History:  Procedure Laterality Date  . CESAREAN SECTION    . CHOLECYSTECTOMY    . CORONARY STENT INTERVENTION N/A 12/20/2018   Procedure: CORONARY STENT INTERVENTION;  Surgeon: Runell Gess, MD;  Location: [facility] INVASIVE CV LAB;  Service: Cardiovascular;  Laterality: N/A;  . LEFT HEART CATH AND CORONARY ANGIOGRAPHY N/A 12/20/2018   Procedure: LEFT HEART CATH AND CORONARY ANGIOGRAPHY;  Surgeon: Runell Gess, MD;  Location: [facility] INVASIVE CV LAB;  Service: Cardiovascular;  Laterality: N/A;     OB History   No obstetric history on file.     Family History  Problem Relation Age of Onset  . Heart attack Mother 51  . Hypertension Mother   . Hypertension Father   . Heart attack Maternal Grandmother 55  . Diabetes Maternal Grandmother   . Hypertension Sister   . Hypertension Paternal  Grandmother   . Breast cancer Paternal Grandmother   . Diabetes Paternal Grandmother   . Cervical cancer Paternal Grandmother   . Heart attack Paternal Grandfather 106  . Hypertension Paternal Grandfather     Social History   Tobacco Use  . Smoking status: Light Tobacco Smoker    Packs/day: 0.25    Types: Cigarettes  . Smokeless tobacco: Never Used  . Tobacco comment: down to 1 cigarrettes per day  Substance Use Topics  . Alcohol use: Never  . Drug use: Never    Home Medications Prior to Admission medications   Medication Sig Start Date End Date Taking? Authorizing Provider  amLODipine (NORVASC) 10 MG tablet Take 1 tablet (10 mg total) by mouth daily. 12/27/18   Berton Bon, NP  aspirin 81 MG chewable tablet Chew 1 tablet (81 mg total) by mouth daily. 12/22/18   Azalee Course, PA  carvedilol (COREG) 12.5 MG tablet Take 1 tablet (12.5 mg total) by mouth 2 (two) times daily. 04/16/19 05/16/19  Pricilla Riffle, MD  Evolocumab (REPATHA SURECLICK) 140 MG/ML SOAJ Inject 140 mg into the skin every 14 (fourteen) days.    [provider]  lisinopril (ZESTRIL) 20 MG tablet Take 0.5 tablets (10 mg total) by mouth daily. 05/09/19   Pricilla Riffle, MD  Multiple Vitamins-Calcium (ONE-A-DAY WOMENS PO) Take 1 tablet by mouth daily with breakfast.  [provider]  nitroGLYCERIN (NITROSTAT) 0.4 MG SL tablet Place 1 tablet (0.4 mg total) under the tongue every 5 (five) minutes x 3 doses as needed for chest pain. 12/21/18   Azalee Course, PA  spironolactone (ALDACTONE) 25 MG tablet Take 1 tablet (25 mg total) by mouth 2 (two) times daily. Please make follow up appt with Dr. Tenny Craw for refills. [phone]. 06/17/19   Pricilla Riffle, MD  ticagrelor (BRILINTA) 90 MG TABS tablet Take 1 tablet (90 mg total) by mouth 2 (two) times daily. 12/21/18   Azalee Course, PA    Allergies    Patient has no known allergies.  Review of Systems   Review of Systems  All other systems reviewed and are  negative.   Physical Exam Updated Vital Signs BP (!) 100/45   Pulse 92   Temp 98.1 F (36.7 C) (Oral)   Resp 12   SpO2 100%   Physical Exam Vitals and nursing note reviewed.  Constitutional:      Appearance: She is well-developed. She is ill-appearing.  HENT:     Head: Normocephalic and atraumatic.     Right Ear: External ear normal.     Left Ear: External ear normal.  Eyes:     General: No scleral icterus.       Right eye: No discharge.        Left eye: No discharge.     Conjunctiva/sclera: Conjunctivae normal.  Neck:     Trachea: No tracheal deviation.  Cardiovascular:     Rate and Rhythm: Normal rate and regular rhythm.  Pulmonary:     Effort: Pulmonary effort is normal. No respiratory distress.     Breath sounds: Normal breath sounds. No stridor. No wheezing or rales.  Abdominal:     General: Bowel sounds are normal. There is no distension.     Palpations: Abdomen is soft.     Tenderness: There is abdominal tenderness in the left lower quadrant. There is no guarding or rebound.  Genitourinary:    Comments: No gross blood Musculoskeletal:        General: No tenderness.     Cervical back: Neck supple.  Skin:    General: Skin is warm and dry.     Findings: No rash.  Neurological:     Mental Status: She is alert.     Cranial Nerves: No cranial nerve deficit (no facial droop, extraocular movements intact, no slurred speech).     Sensory: No sensory deficit.     Motor: No abnormal muscle tone or seizure activity.     Coordination: Coordination normal.     ED Results / Procedures / Treatments   Labs (all labs ordered are listed, but only abnormal results are displayed) Labs Reviewed  LIPASE, BLOOD - Abnormal; Notable for the following components:      Result Value   Lipase 62 (*)    All other components within normal limits  COMPREHENSIVE METABOLIC PANEL - Abnormal; Notable for the following components:   Chloride 114 (*)    CO2 11 (*)    Glucose, Bld 122  (*)    BUN 59 (*)    Creatinine, Ser 6.84 (*)    Calcium 8.6 (*)    AST 8 (*)    Total Bilirubin <0.1 (*)    GFR calc non Af Amer 7 (*)    GFR calc Af Amer 8 (*)    All other components within normal limits  CBC - Abnormal; Notable for the  following components:   WBC 17.8 (*)    RBC 3.38 (*)    Hemoglobin 10.9 (*)    HCT 34.3 (*)    MCV 101.5 (*)    All other components within normal limits  URINALYSIS, ROUTINE W REFLEX MICROSCOPIC  I-STAT BETA HCG BLOOD, ED ([facility], [facility], [facility] ONLY)  POC OCCULT BLOOD, ED  TYPE AND SCREEN  ABO/RH    EKG None  Radiology CT ABDOMEN PELVIS WO CONTRAST  Result Date: 10/26/2019 CLINICAL DATA:  44 year old female with LEFT abdominal and pelvic pain and nausea for 1 week. EXAM: CT ABDOMEN AND PELVIS WITHOUT CONTRAST TECHNIQUE: Multidetector CT imaging of the abdomen and pelvis was performed following the standard protocol without IV contrast. COMPARISON:  07/01/2007 CT FINDINGS: Please note that parenchymal abnormalities may be missed without intravenous contrast. Lower chest: No acute abnormality Hepatobiliary: The liver is unremarkable. The patient is status post cholecystectomy. No biliary dilatation. Pancreas: Unremarkable Spleen: Unremarkable Adrenals/Urinary Tract: The kidneys, adrenal glands and bladder are unremarkable. Stomach/Bowel: Stomach is within normal limits. Appendix appears normal. No evidence of bowel wall thickening, distention, or inflammatory changes. Vascular/Lymphatic: Aortic atherosclerosis. No enlarged abdominal or pelvic lymph nodes. Reproductive: Uterus and bilateral adnexa are unremarkable except for tubal ligation clips. Other: No ascites, pneumoperitoneum or focal collection. Musculoskeletal: No acute or suspicious bony abnormalities. IMPRESSION: 1. No evidence of acute abnormality or CT findings to explain this patient's abdominal pain. 2. Aortic Atherosclerosis (ICD10-I70.0). Electronically Signed   By: Margarette Canada M.D.   On: 10/26/2019  17:39    Procedures .Critical Care Performed by: Dorie Rank, MD Authorized by: Dorie Rank, MD   Critical care provider statement:    Critical care time (minutes):  45   Critical care was time spent personally by me on the following activities:  Discussions with consultants, evaluation of patient's response to treatment, examination of patient, ordering and performing treatments and interventions, ordering and review of laboratory studies, ordering and review of radiographic studies, pulse oximetry, re-evaluation of patient's condition, obtaining history from patient or surrogate and review of old charts   (including critical care time)  Medications Ordered in ED Medications  sodium chloride 0.9 % bolus 2,000 mL (2,000 mLs Intravenous New Bag/Given 10/26/19 1715)    Followed by  0.9 %  sodium chloride infusion (has no administration in time range)  sodium chloride flush (NS) 0.9 % injection 3 mL (3 mLs Intravenous Given 10/26/19 1817)    ED Course  I have reviewed the triage vital signs and the nursing notes.  Pertinent labs & imaging results that were available during my care of the patient were reviewed by me and considered in my medical decision making (see chart for details).  Clinical Course as of Oct 25 1920  Sun Oct 26, 2019  1916 CT scan does not show evidence of acute abnormality   [JK]  1917 No evidence of urinary retention on CT scan.   [JK]  1917 Labs notable for acute renal failure.   [JK]    Clinical Course User Index [JK] Dorie Rank, MD   MDM Rules/Calculators/A&P                     MDM Number of Diagnoses or Management Options Acute renal failure, unspecified acute renal failure type [facility]): new, needed workup Dehydration: new, needed workup   Amount and/or Complexity of Data Reviewed Clinical lab tests: ordered and reviewed Tests in the radiology section of CPT: ordered and reviewed Discussion of test  results with the performing providers: yes Decide  to obtain previous medical records or to obtain history from someone other than the patient: yes Review and summarize past medical records: yes Discuss the patient with other providers: yes  Risk of Complications, Morbidity, and/or Mortality Presenting problems: high Diagnostic procedures: high Management options: high    Patient presents to the ED for evaluation of vomiting and diarrhea.  Patient was noted to have borderline blood pressures initially.  No fevers.  No signs of sepsis.  Patient was concerned about the possibility of dark stools and she does have mild anemia but she is Hemoccult negative.  Patient's labs are notable for significant elevation in her creatinine consistent with acute renal failure.  Patient has now noticed some decreased urine output.  I suspect this is a combination of her dehydration from the vomiting and diarrhea.  Her ACE inhibitor medications likely contributed to her acute renal failure.  Patient has started on IV fluids.  Will need to monitor her renal function and see if she responds to treatment.  Her CT scan does not show evidence of obstruction, acute infection or hydronephrosis.  I will consult with the medical service for admission and further treatment. Final Clinical Impression(s) / ED Diagnoses Final diagnoses:  Acute renal failure, unspecified acute renal failure type (HCC)  Dehydration      Linwood Dibbles, MD 10/26/19 [phone]

## 2019-10-26 NOTE — H&P (Addendum)
Date: 10/26/2019               Patient Name:  Isabel [name] MRN: [id number]  DOB: 1975/10/30 Age / Sex: 44 y.o., female   PCP: Patient, No Pcp Per         Medical Service: Internal Medicine Teaching Service         Attending Physician: Dr. Sandre Kitty, Elwin Mocha, MD    First Contact: Dr. Darl Pikes Pager: [phone]  Second Contact: Dr. Cleaster Corin Pager: [phone]       After Hours (After 5p/  First Contact Pager: [phone]  weekends / holidays): Second Contact Pager: [phone]   Chief Complaint: Vomiting and diarrhea  History of Present Illness: Isabel [name] is a 44 year old female with past medical history of HTN, HLD, CAD with NSTEMI  11/2018 status post stent in 2020 who presents with approximately a week of vomiting and diarrhea.  Patient reports her vomiting and diarrhea started a week ago and she has had a hard time keeping anything down. The symptoms come on with eating or not eating. She has noticed her vomit and stools have been dark black colored. Denies any bright red blood. She has tried some Pepto-Bismol which did not help.  For the last 2 or 3 days she has not been able to keep medications down.  She denies any other sick contacts around her or anyone else experiencing these symptoms.  She has some left lower quadrant abdominal pain which is mild and comes on periodically.  The symptoms are similar to symptoms she had 15 to 20 years ago with her gallbladder, but is status post cholecystectomy. She denies any chest pain, shortness of breath, dysuria, new headaches, or falls.   Meds:  Current Meds  Medication Sig  . acetaminophen (TYLENOL) 500 MG tablet Take 1,000 mg by mouth every 6 (six) hours as needed for headache (pain).  Marland Kitchen amLODipine (NORVASC) 10 MG tablet Take 1 tablet (10 mg total) by mouth daily.  Marland Kitchen aspirin 81 MG chewable tablet Chew 1 tablet (81 mg total) by mouth daily.  . Evolocumab (REPATHA SURECLICK) 140 MG/ML SOAJ Inject 140 mg into the skin See admin instructions.  Inject 140 mg subcutaneously on the 1st and 15th of each month  . ibuprofen (ADVIL) 200 MG tablet Take 600 mg by mouth every 6 (six) hours as needed (pain).  Marland Kitchen lisinopril (ZESTRIL) 40 MG tablet Take 20 mg by mouth 2 (two) times daily.   . nitroGLYCERIN (NITROSTAT) 0.4 MG SL tablet Place 1 tablet (0.4 mg total) under the tongue every 5 (five) minutes x 3 doses as needed for chest pain.  Marland Kitchen spironolactone (ALDACTONE) 25 MG tablet Take 1 tablet (25 mg total) by mouth 2 (two) times daily. Please make follow up appt with Dr. Tenny Craw for refills. [phone].  Marland Kitchen ticagrelor (BRILINTA) 90 MG TABS tablet Take 1 tablet (90 mg total) by mouth 2 (two) times daily.     Allergies: Allergies as of 10/26/2019  . (No Known Allergies)   Past Medical History:  Diagnosis Date  . CAD (coronary artery disease)   . Hyperlipidemia LDL goal <70   . Hypertension   . NSTEMI (non-ST elevated myocardial infarction) (HCC) 12/19/2018  . Tobacco abuse     Family History:  Family History  Problem Relation Age of Onset  . Heart attack Mother 73  . Hypertension Mother   . Hypertension Father   . Heart attack Maternal Grandmother 55  . Diabetes Maternal Grandmother   .  Hypertension Sister   . Hypertension Paternal Grandmother   . Breast cancer Paternal Grandmother   . Diabetes Paternal Grandmother   . Cervical cancer Paternal Grandmother   . Heart attack Paternal Grandfather 74  . Hypertension Paternal Grandfather     Social History:  Lives at home with her husband and 4 sons (ages 39-18).  Quit smoking after MI in 2020. No alcohol use. No recreational drug use.   Review of Systems: A complete ROS was negative except as per HPI.   Physical Exam: Blood pressure 111/63, pulse 92, temperature 99.1 F (37.3 C), temperature source Oral, resp. rate 17, SpO2 100 %.   General: NAD, nl appearance H: Normocephalic, atraumatic  E: EOMI, Conjunctivae normal EN: No congestion, no rhinorrhea, T: no exudate or  erythema  Cardiovascular: Normal rate, regular rhythm.  No murmurs, rubs, or gallops Pulmonary : Effort normal, breath sounds normal. No wheezes, rales, or rhonchi Abdominal: soft, nontender,  bowel sounds present Musculoskeletal: no swelling , deformity, injury ,or tenderness in extremities, Skin: Warm, dry , no bruising, erythema, or rash Psychiatric/Behavioral:  normal mood, normal behavior  Neuro: A&Ox4, No focal deficent    CT Abdomen Pelvis WO contrast IMPRESSION: 1. No evidence of acute abnormality or CT findings to explain this patient's abdominal pain. 2. Aortic Atherosclerosis (ICD10-I70.0).   Assessment & Plan by Problem: Principal Problem:   Acute renal injury ([place]) Active Problems:   Hypertension   CAD (coronary artery disease)   Hyperlipidemia  Isabel [name] is a 44 year old female with past medical history of HTN, HLD, CAD with NSTEMI  11/2018 status post stent in 2020 who presents with approximately a week of vomiting and diarrhea.    AKI on CKD in setting of Gastritis  NAGMA Acute renal failure likely prerenal in setting of 1 week of nausea and vomiting.  Patient's GI symptoms seems to be from a gastritis which may be resolved, but we will check a GI Panel. Patient has CKD stage 3 per labs, which has acutely progressed. Baseline Cr 1.6-2? Per chart review lisinopril reduced after bump in Cr 04/2019, but noted patient didn't follow up for repeat labs. Blood pressures are soft, patient asymptomatic, will give fluid bolus and continue IV fluids.  Plan: -Hold Lisinopril -LR at 125 mL/h -Follow GI panel -Enteric precautions - Monitor ins and outs  - Monitor kidney function and electrolytes with labs   Coronary artery disease Hyperlipidemia Continue home meds as below: -Evolucumab q14 days, did not tolerated statin therapy in the past  -Coreg 12.5 mg twice daily -Brilinta  HTN -Hold amlodipine  - Hold home lisinopril in setting of AKI  Code: Full Diet:  Clears VTE prophylaxis: SCDs in setting of gastritis  Dispo: Admit patient to Inpatient with expected length of stay greater than 2 midnights.  Signed: Tamsen Snider, MD PGY1

## 2019-10-26 NOTE — ED Notes (Signed)
Patient is being discharged from the [facility]gent [facility] and sent to the Emergency Department via EMS by provider Nile Riggs FNP .  patient is stable but in need of higher level of care due to possible gi bleed . Patient is aware and verbalizes understanding of plan of care.  Vitals:   10/26/19 1527  BP: (!) 94/39  Pulse: (!) 114  Resp: 20  Temp: 97.9 F (36.6 C)  SpO2: 98%

## 2019-10-26 NOTE — ED Triage Notes (Signed)
Pt presents with fatigue, dark stools and vomiting for past 7 days. Pt skin is pale. Provider made aware

## 2019-10-26 NOTE — ED Triage Notes (Signed)
20 gauge iv started in right AC. EMS called for transport to ED

## 2019-10-27 DIAGNOSIS — N179 Acute kidney failure, unspecified: Principal | ICD-10-CM

## 2019-10-27 LAB — URINALYSIS, ROUTINE W REFLEX MICROSCOPIC
Bilirubin Urine: NEGATIVE
Glucose, UA: NEGATIVE mg/dL
Ketones, ur: NEGATIVE mg/dL
Nitrite: NEGATIVE
Protein, ur: 30 mg/dL — AB
Specific Gravity, Urine: 1.01 (ref 1.005–1.030)
pH: 5 (ref 5.0–8.0)

## 2019-10-27 LAB — CBC WITH DIFFERENTIAL/PLATELET
Abs Immature Granulocytes: 0.04 10*3/uL (ref 0.00–0.07)
Basophils Absolute: 0 10*3/uL (ref 0.0–0.1)
Basophils Relative: 0 %
Eosinophils Absolute: 0.2 10*3/uL (ref 0.0–0.5)
Eosinophils Relative: 2 %
HCT: 25.7 % — ABNORMAL LOW (ref 36.0–46.0)
Hemoglobin: 8 g/dL — ABNORMAL LOW (ref 12.0–15.0)
Immature Granulocytes: 0 %
Lymphocytes Relative: 18 %
Lymphs Abs: 2.2 10*3/uL (ref 0.7–4.0)
MCH: 32.3 pg (ref 26.0–34.0)
MCHC: 31.1 g/dL (ref 30.0–36.0)
MCV: 103.6 fL — ABNORMAL HIGH (ref 80.0–100.0)
Monocytes Absolute: 0.8 10*3/uL (ref 0.1–1.0)
Monocytes Relative: 7 %
Neutro Abs: 8.5 10*3/uL — ABNORMAL HIGH (ref 1.7–7.7)
Neutrophils Relative %: 73 %
Platelets: 243 10*3/uL (ref 150–400)
RBC: 2.48 MIL/uL — ABNORMAL LOW (ref 3.87–5.11)
RDW: 12.7 % (ref 11.5–15.5)
WBC: 11.8 10*3/uL — ABNORMAL HIGH (ref 4.0–10.5)
nRBC: 0 % (ref 0.0–0.2)

## 2019-10-27 LAB — CBC
HCT: 25.1 % — ABNORMAL LOW (ref 36.0–46.0)
Hemoglobin: 8.1 g/dL — ABNORMAL LOW (ref 12.0–15.0)
MCH: 32.8 pg (ref 26.0–34.0)
MCHC: 32.3 g/dL (ref 30.0–36.0)
MCV: 101.6 fL — ABNORMAL HIGH (ref 80.0–100.0)
Platelets: 236 10*3/uL (ref 150–400)
RBC: 2.47 MIL/uL — ABNORMAL LOW (ref 3.87–5.11)
RDW: 12.7 % (ref 11.5–15.5)
WBC: 12.3 10*3/uL — ABNORMAL HIGH (ref 4.0–10.5)
nRBC: 0 % (ref 0.0–0.2)

## 2019-10-27 LAB — COMPREHENSIVE METABOLIC PANEL
ALT: 10 U/L (ref 0–44)
AST: 8 U/L — ABNORMAL LOW (ref 15–41)
Albumin: 2.6 g/dL — ABNORMAL LOW (ref 3.5–5.0)
Alkaline Phosphatase: 65 U/L (ref 38–126)
Anion gap: 10 (ref 5–15)
BUN: 52 mg/dL — ABNORMAL HIGH (ref 6–20)
CO2: 9 mmol/L — ABNORMAL LOW (ref 22–32)
Calcium: 7.9 mg/dL — ABNORMAL LOW (ref 8.9–10.3)
Chloride: 119 mmol/L — ABNORMAL HIGH (ref 98–111)
Creatinine, Ser: 5.02 mg/dL — ABNORMAL HIGH (ref 0.44–1.00)
GFR calc Af Amer: 11 mL/min — ABNORMAL LOW (ref >60)
GFR calc non Af Amer: 10 mL/min — ABNORMAL LOW (ref >60)
Glucose, Bld: 93 mg/dL (ref 70–99)
Potassium: 4 mmol/L (ref 3.5–5.1)
Sodium: 138 mmol/L (ref 135–145)
Total Bilirubin: 0.6 mg/dL (ref 0.3–1.2)
Total Protein: 5.1 g/dL — ABNORMAL LOW (ref 6.5–8.1)

## 2019-10-27 LAB — FERRITIN: Ferritin: 71 ng/mL (ref 11–307)

## 2019-10-27 LAB — CREATININE, URINE, RANDOM: Creatinine, Urine: 283.94 mg/dL

## 2019-10-27 LAB — IRON AND TIBC
Iron: 70 ug/dL (ref 28–170)
Saturation Ratios: 32 % — ABNORMAL HIGH (ref 10.4–31.8)
TIBC: 217 ug/dL — ABNORMAL LOW (ref 250–450)
UIBC: 147 ug/dL

## 2019-10-27 LAB — RETICULOCYTES
Immature Retic Fract: 11 % (ref 2.3–15.9)
RBC.: 2.53 MIL/uL — ABNORMAL LOW (ref 3.87–5.11)
Retic Count, Absolute: 34.2 10*3/uL (ref 19.0–186.0)
Retic Ct Pct: 1.4 % (ref 0.4–3.1)

## 2019-10-27 LAB — PROTEIN / CREATININE RATIO, URINE
Creatinine, Urine: 282.58 mg/dL
Protein Creatinine Ratio: 0.44 mg/mg{Cre} — ABNORMAL HIGH (ref 0.00–0.15)
Total Protein, Urine: 124 mg/dL

## 2019-10-27 LAB — VITAMIN B12: Vitamin B-12: 242 pg/mL (ref 180–914)

## 2019-10-27 LAB — LACTATE DEHYDROGENASE: LDH: 64 U/L — ABNORMAL LOW (ref 98–192)

## 2019-10-27 LAB — TSH: TSH: 0.407 u[IU]/mL (ref 0.350–4.500)

## 2019-10-27 MED ORDER — PANTOPRAZOLE SODIUM 40 MG PO TBEC
40.0000 mg | DELAYED_RELEASE_TABLET | Freq: Every day | ORAL | Status: DC
  Administered 2019-10-28 – 2019-10-29 (×2): 40 mg via ORAL
  Filled 2019-10-27 (×2): qty 1

## 2019-10-27 MED ORDER — ASPIRIN 81 MG PO CHEW
81.0000 mg | CHEWABLE_TABLET | Freq: Every day | ORAL | Status: DC
  Administered 2019-10-27 – 2019-10-29 (×3): 81 mg via ORAL
  Filled 2019-10-27 (×3): qty 1

## 2019-10-27 MED ORDER — LACTATED RINGERS IV BOLUS
500.0000 mL | Freq: Once | INTRAVENOUS | Status: AC
  Administered 2019-10-27: 500 mL via INTRAVENOUS

## 2019-10-27 MED ORDER — SODIUM BICARBONATE-DEXTROSE 150-5 MEQ/L-% IV SOLN
150.0000 meq | INTRAVENOUS | Status: DC
  Administered 2019-10-27 – 2019-10-28 (×3): 150 meq via INTRAVENOUS
  Filled 2019-10-27 (×6): qty 1000

## 2019-10-27 NOTE — Progress Notes (Signed)
NAME:  Isabel [name], MRN:  [id number], DOB:  04-23-1976, LOS: 1 ADMISSION DATE:  10/26/2019   Brief History  44 yo female with HTN, CAD, NSTEMI s/p PCI (2020).  Presented to [facility] on 10/26/19 for one week history of dark colored vomitting/diarrhea and anorexia. Labs in the ED revealed acute on chronic renal failure and leukocytosis; BUN 59, Cr 6.84, HCO3 11 WBC 18  Subjective  No significant overnight events. Pt notes having 2 episodes of diarrhea overnight. No emesis overnight. Notes that sx began with fatigue 9d prior to admission followed by slow worsening of diarrhea, n/v about 7d prior to admission. Notes that during that time, she did have some colicky left sided abdominal pain but denies back pain or dysuria. No family members with similar symptoms.   Significant Hospital Events   5/30 admission 5/31 no n/v overnight. 2 episodes of diarrhea. Some improvement in renal function with IVF--obtaining urine studies   Objective   Blood pressure (!) 95/49, pulse 76, temperature 98.2 F (36.8 C), resp. rate 18, height 5\' 6"  (1.676 m), weight 84.5 kg, SpO2 100 %.     Intake/Output Summary (Last 24 hours) at 10/27/2019 0550 Last data filed at 10/27/2019 0400 Gross per 24 hour  Intake 579.26 ml  Output 200 ml  Net 379.26 ml   Filed Weights   10/26/19 2315  Weight: 84.5 kg    Examination: GENERAL: in no acute distress CARDIAC: heart RRR. No peripheral edema.  PULMONARY: breathing comfortably on room air ABDOMEN: soft. Hyperactive bs. Nontender to palpation.  Nondistended.  Skin: no rash  Consults:  none  Significant Diagnostic Tests:  5/30 CT a/p> no acute findings  Micro Data:  GI panel>  Antimicrobials:  none  Summary  44 yo female admitted to [facility] on 10/26/19 for 1w hx of gastroenteritis and found to have AKI. Chart review is indicating a progressively declining renal function over the past year. We are currently working up her renal failure and giving her IVF.    Assessment & Plan:  Principal Problem:   Acute renal injury ([place]) Active Problems:   Hypertension   CAD (coronary artery disease)   Hyperlipidemia  Gastroenteritis. Suspicious for bacterial infection given the length of symptoms. White count could be reactive from n/v alone or infection.  Pt noted dark colored stools and vomit however hemocult neg. Suspect may be 2/2 peptobismal use.  No n/v overnight. 2 episodes of diarrhea.  Plan --follow GI panel  AKI. Suspect to be 2/2 volume depletion in the setting of profuse n/v/diarrhea and limited oral intake. NAGMA consistent with this however can not rule out other etiologies. Interestingly, chart review indicates that her renal function has been slowly declining over the past 1.5years without a clearcut reason. She had been started on an ACE which could have a sudden increase in creatinine however renal function remained worse than baseline even after decreasing it. AIN considered however no comment on UA regarding eosinophils. Would typically expect eosinophil count to be >500 however not necessary for dx.  Microscopic hematuria. Would consider ATN however no consistent casts appreciated on UA. Given her narrative regarding the collicky abdominal pain that she had a few days ago, could consider nephrolithiasis that has since resolved however unsure how GI sx would fit into this. Can not rule out a malignancy related hematuria--she does have a smoking hx. Autoimmune process also on the differential however given her GI sx, would consider this less likely. Asymptomatic bacturia. Pyelonephritis considered however would expect  to be able to see some stranding on abdominal CT.  Plan --obtaining a urine creatinine and protein/creatinine level to calculate FeNa. Urine sodium already collected. --VBG for further evaluation of acid-base status --continue IVF with bicarb --given lack of sx, I don't see an indication to treat the bacturia at this time.   --consider consulting nephrology if no significant improvement in renal function --will need a repeat UA after discharge to assure resolution of hematuria  Acute macrocytic anemia. Normal on labs one year ago. Unclear etiology. Overnight drop suspected to be dilutional in nature.  Broad ddx including rapid progression of renal failure, autoimmune, hemolytic process, HUS, hematuria, nutritional deficiency, or other cryptogenic source of bleed. Slightly macrocytic which would support the nutritional theory however difficult to ignore the hematuria and renal failure that could potentially superimpose an iron def/ICD aspect on it. Plan: Will obtain hemolytic and iron studies. Continue to monitor hgb. Transfuse for hgb less than 7.  CAD s/p NSTEMI s/p PCI. Hypertension. Continue evolucumab, coreg, brilinta. Aspirin held on admission due to GI concerns however will resume today. Continue coreg. Hold lisinopril and amlodipine   Best practice:  CODE STATUS: Full Diet: clears DVT for prophylaxis: SCD. Consider starting pharmacologic prophylaxis in am if hgb stable. Dispo: pending further evaluation   Elige Radon, MD INTERNAL MEDICINE RESIDENT PGY-1 PAGER #: [phone] 10/27/19  5:50 AM  Labs    CBC Latest Ref Rng & Units 10/27/2019 10/26/2019 12/21/2018  WBC 4.0 - 10.5 K/uL 12.3(H) 17.8(H) 10.8(H)  Hemoglobin 12.0 - 15.0 g/dL 8.1(L) 10.9(L) 12.6  Hematocrit 36.0 - 46.0 % 25.1(L) 34.3(L) 37.9  Platelets 150 - 400 K/uL 236 365 222   BMP Latest Ref Rng & Units 10/27/2019 10/26/2019 05/02/2019  Glucose 70 - 99 mg/dL 93 357(S) 89  BUN 6 - 20 mg/dL 17(B) 93(J) 18  Creatinine 0.44 - 1.00 mg/dL 0.30(S) 9.23(R) 0.07(M)  BUN/Creat Ratio 9 - 23 - - 9  Sodium 135 - 145 mmol/L 138 138 138  Potassium 3.5 - 5.1 mmol/L 4.0 4.5 4.9  Chloride 98 - 111 mmol/L 119(H) 114(H) 109(H)  CO2 22 - 32 mmol/L 9(L) 11(L) 16(L)  Calcium 8.9 - 10.3 mg/dL 7.9(L) 8.6(L) 8.9

## 2019-10-27 NOTE — Plan of Care (Signed)
  Problem: Education: Goal: Knowledge of General Education information will improve Description: Including pain rating scale, medication(s)/side effects and non-pharmacologic comfort measures 10/27/2019 0023 by Johnn Hai, RN Outcome: Progressing 10/27/2019 0022 by Johnn Hai, RN Outcome: Progressing   Problem: Education: Goal: Knowledge of disease and its progression will improve Outcome: Progressing

## 2019-10-27 NOTE — Progress Notes (Signed)
New Admission Note: ? Arrival Method: Stretcher Mental Orientation: Alert and Oriented x4 Telemetry: No Assessment: Completed Skin: Refer to flowsheet IV: Right Antecubital Pain: 0/10 Tubes: None Safety Measures: Safety Fall Prevention Plan discussed with patient. Admission: Completed 5 [place] Orientation: Patient has been orientated to the room, unit and the staff. Family: None Orders have been reviewed and are being implemented. Will continue to monitor the patient. Call light has been placed within reach and bed alarm has been activated.  ? Donia Guiles, RN  Phone Number: [phone]

## 2019-10-28 DIAGNOSIS — K047 Periapical abscess without sinus: Secondary | ICD-10-CM

## 2019-10-28 DIAGNOSIS — R197 Diarrhea, unspecified: Secondary | ICD-10-CM

## 2019-10-28 DIAGNOSIS — D649 Anemia, unspecified: Secondary | ICD-10-CM

## 2019-10-28 LAB — RENAL FUNCTION PANEL
Albumin: 2.7 g/dL — ABNORMAL LOW (ref 3.5–5.0)
Anion gap: 8 (ref 5–15)
BUN: 35 mg/dL — ABNORMAL HIGH (ref 6–20)
CO2: 20 mmol/L — ABNORMAL LOW (ref 22–32)
Calcium: 7.8 mg/dL — ABNORMAL LOW (ref 8.9–10.3)
Chloride: 113 mmol/L — ABNORMAL HIGH (ref 98–111)
Creatinine, Ser: 2.46 mg/dL — ABNORMAL HIGH (ref 0.44–1.00)
GFR calc Af Amer: 27 mL/min — ABNORMAL LOW (ref >60)
GFR calc non Af Amer: 23 mL/min — ABNORMAL LOW (ref >60)
Glucose, Bld: 105 mg/dL — ABNORMAL HIGH (ref 70–99)
Phosphorus: 2.1 mg/dL — ABNORMAL LOW (ref 2.5–4.6)
Potassium: 2.7 mmol/L — CL (ref 3.5–5.1)
Sodium: 141 mmol/L (ref 135–145)

## 2019-10-28 LAB — CBC
HCT: 22.2 % — ABNORMAL LOW (ref 36.0–46.0)
Hemoglobin: 7.5 g/dL — ABNORMAL LOW (ref 12.0–15.0)
MCH: 32.3 pg (ref 26.0–34.0)
MCHC: 33.8 g/dL (ref 30.0–36.0)
MCV: 95.7 fL (ref 80.0–100.0)
Platelets: 230 10*3/uL (ref 150–400)
RBC: 2.32 MIL/uL — ABNORMAL LOW (ref 3.87–5.11)
RDW: 12.4 % (ref 11.5–15.5)
WBC: 9.4 10*3/uL (ref 4.0–10.5)
nRBC: 0 % (ref 0.0–0.2)

## 2019-10-28 LAB — HAPTOGLOBIN: Haptoglobin: 217 mg/dL (ref 42–296)

## 2019-10-28 MED ORDER — ONDANSETRON HCL 4 MG/2ML IJ SOLN
4.0000 mg | Freq: Four times a day (QID) | INTRAMUSCULAR | Status: DC | PRN
  Administered 2019-10-28 (×2): 4 mg via INTRAVENOUS
  Filled 2019-10-28 (×2): qty 2

## 2019-10-28 MED ORDER — K PHOS MONO-SOD PHOS DI & MONO 155-852-130 MG PO TABS
250.0000 mg | ORAL_TABLET | Freq: Two times a day (BID) | ORAL | Status: AC
  Administered 2019-10-28 (×2): 250 mg via ORAL
  Filled 2019-10-28 (×2): qty 1

## 2019-10-28 MED ORDER — AMOXICILLIN-POT CLAVULANATE 875-125 MG PO TABS
1.0000 | ORAL_TABLET | Freq: Two times a day (BID) | ORAL | Status: DC
  Administered 2019-10-28 – 2019-10-29 (×3): 1 via ORAL
  Filled 2019-10-28 (×3): qty 1

## 2019-10-28 MED ORDER — AMOXICILLIN-POT CLAVULANATE 500-125 MG PO TABS: 1.0000 | ORAL_TABLET | Freq: Three times a day (TID) | ORAL | Status: DC

## 2019-10-28 MED ORDER — HYDROCORTISONE 1 % EX OINT
TOPICAL_OINTMENT | Freq: Once | CUTANEOUS | Status: AC
  Filled 2019-10-28: qty 28

## 2019-10-28 MED ORDER — POTASSIUM CHLORIDE CRYS ER 20 MEQ PO TBCR
40.0000 meq | EXTENDED_RELEASE_TABLET | Freq: Two times a day (BID) | ORAL | Status: AC
  Administered 2019-10-28 (×2): 40 meq via ORAL
  Filled 2019-10-28 (×2): qty 2

## 2019-10-28 MED ORDER — LORATADINE 10 MG PO TABS
10.0000 mg | ORAL_TABLET | Freq: Once | ORAL | Status: AC
  Administered 2019-10-28: 10 mg via ORAL
  Filled 2019-10-28: qty 1

## 2019-10-28 MED ORDER — POTASSIUM CHLORIDE 10 MEQ/100ML IV SOLN
10.0000 meq | INTRAVENOUS | Status: AC
  Administered 2019-10-28 (×6): 10 meq via INTRAVENOUS
  Filled 2019-10-28 (×6): qty 100

## 2019-10-28 MED ORDER — LACTATED RINGERS IV SOLN: INTRAVENOUS | Status: AC

## 2019-10-28 MED ORDER — DIPHENHYDRAMINE HCL 25 MG PO CAPS
25.0000 mg | ORAL_CAPSULE | Freq: Two times a day (BID) | ORAL | Status: DC
  Administered 2019-10-28 – 2019-10-29 (×2): 25 mg via ORAL
  Filled 2019-10-28 (×2): qty 1

## 2019-10-28 NOTE — Progress Notes (Addendum)
HD#2 Subjective:  Overnight Events: no significant events overnight.  Isabel [name] continues to have diarrhea, nausea, and vomiting. She states that she has a mouth abscess this caused significant amounts of pain.  She believes that the abscess may have popped overnight which is decreased her pain.  Otherwise she is only able to tolerate clear liquids so far but continues to have some cyst.  She does not have significant abdominal pain.  Objective:  Vital signs in last 24 hours: Vitals:   10/27/19 1724 10/27/19 2050 10/28/19 0004 10/28/19 0455  BP: (!) 107/47 (!) 98/39 (!) 106/51 116/66  Pulse: 79 87 80 84  Resp: 18 18 18 18   Temp: 99.3 F (37.4 C) 99.8 F (37.7 C) 99.3 F (37.4 C) 98.7 F (37.1 C)  TempSrc: Oral Oral Oral Oral  SpO2: 100% 99% 100% 99%  Weight:  85.7 kg    Height:       Supplemental O2: Room air SpO2: 99 %   Physical Exam:  Physical Exam  Constitutional: She is oriented to person, place, and time and well-developed, well-nourished, and in no distress.  HENT:  Head: Normocephalic and atraumatic.  Mouth/Throat: No trismus in the jaw. Dental abscesses and dental caries present. No oropharyngeal exudate.    Eyes: EOM are normal.  Neck: No tracheal deviation present.  Cardiovascular: Normal rate and intact distal pulses. Exam reveals no gallop and no friction rub.  No murmur heard. Pulmonary/Chest: Effort normal and breath sounds normal. No respiratory distress.  Abdominal: Soft. Bowel sounds are normal. She exhibits no distension. There is no abdominal tenderness.  Musculoskeletal:        General: No tenderness or edema. Normal range of motion.  Neurological: She is alert and oriented to person, place, and time.  Skin: Skin is warm and dry.    Filed Weights   10/26/19 2315 10/27/19 2050  Weight: 84.5 kg 85.7 kg     Intake/Output Summary (Last 24 hours) at 10/28/2019 0600 Last data filed at 10/28/2019 0445 Gross per 24 hour  Intake 3093.97 ml    Output 700 ml  Net 2393.97 ml   Net IO Since Admission: 2,773.23 mL [10/28/19 0600]  Pertinent Labs: CBC Latest Ref Rng & Units 10/28/2019 10/27/2019 10/27/2019  WBC 4.0 - 10.5 K/uL 9.4 11.8(H) 12.3(H)  Hemoglobin 12.0 - 15.0 g/dL 7.5(L) 8.0(L) 8.1(L)  Hematocrit 36.0 - 46.0 % 22.2(L) 25.7(L) 25.1(L)  Platelets 150 - 400 K/uL 230 243 236    CMP Latest Ref Rng & Units 10/28/2019 10/27/2019 10/26/2019  Glucose 70 - 99 mg/dL 10/28/2019) 93 132(G)  BUN 6 - 20 mg/dL 401(U) 27(O) 53(G)  Creatinine 0.44 - 1.00 mg/dL 64(Q) 0.34(V) 4.25(Z)  Sodium 135 - 145 mmol/L 141 138 138  Potassium 3.5 - 5.1 mmol/L 2.7(LL) 4.0 4.5  Chloride 98 - 111 mmol/L 113(H) 119(H) 114(H)  CO2 22 - 32 mmol/L 20(L) 9(L) 11(L)  Calcium 8.9 - 10.3 mg/dL 7.8(L) 7.9(L) 8.6(L)  Total Protein 6.5 - 8.1 g/dL - 5.1(L) 7.0  Total Bilirubin 0.3 - 1.2 mg/dL - 0.6 <0.1(L)  Alkaline Phos 38 - 126 U/L - 65 80  AST 15 - 41 U/L - 8(L) 8(L)  ALT 0 - 44 U/L - 10 10    Pending Labs: None  Imaging: No results found.    Assessment/Plan:   Principal Problem:   Acute renal injury (HCC) Active Problems:   Hypertension   CAD (coronary artery disease)   Hyperlipidemia    Patient Summary:  Isabel [name] is a 44 y.o. who  has a past medical history of CAD (coronary artery disease), Hyperlipidemia LDL goal <70, Hypertension, NSTEMI (non-ST elevated myocardial infarction) ([place]) (12/19/2018), and Tobacco abuse., presented with diarrhea and vomiting and admit for acute on chronic kidney failure on hospital day 2  #Gastroenteritis, unknown etiology - GI panel pending.  -Nausea, vomiting, diarrhea -We will continue symptomatic management of her GI symptoms -There is no evidence of inflammatory diarrhea -Abdominal exam is benign on examination  Acute on chronic kidney failure -Likely prerenal azotemia.  His significantly improved since admission. -Current creatinine is roughly 2.5 which is down from 6.5 on admission. -We will  continue to monitor with daily renal function. -She will likely need further evaluation and management in the outpatient setting for underlying etiologies for her acute renal failure. -Patient continues to have some RBCs on urinalysis.  Some protein seen in the urine.  Not have nephrotic range proteinuria.  Considering the patient has had improvement of her renal function at this time is likely that she has prerenal azotemia.  Normocytic anemia -Pending work-up for macrocytic anemia, B12 low end of normal at 242.  -LDH is low at 64 and haptoglobin normal at 217 indicating likely nonhemolytic process.  -Methylmalonic acid pending. -Ferritin low end of normal at 71 -Iron low and normal at 70, TIBC low at 217 with an increased iron saturation ratio of 32.  This is likely indicating anemia of chronic disease. -Reticulocyte index of 0.56 getting hypoproliferative process.  Dental abscess: -Patient has an oral abscess superior to tooth #6 on the right.  On reevaluation there is no exudate appreciated. -Start Augmentin 875-125 for 5 days for empiric antibiotic coverage. -Consulted oral surgeon for further evaluation and management. - Consider CT scan of face.   Diet:  Clear liquids  IVF: None,None VTE: SCDs Code: Full PT/OT recs: None TOC recs: None   Dispo: Anticipated discharge need further evaluation management likely in 1 to 2 days.Marland Kitchen    Marianna Payment, D.O. [facility], PGY-1 Date 10/28/2019 Time 6:00 AM Pager: [phone]

## 2019-10-28 NOTE — Progress Notes (Signed)
CRITICAL VALUE ALERT  Critical Value:  K+ 2.7  Date & Time Notied:  10/28/2019 @0532   Provider Notified: IMTS  Orders Received/Actions taken: PO  K+ 

## 2019-10-28 NOTE — Plan of Care (Signed)
  Problem: Activity: Goal: Risk for activity intolerance will decrease Outcome: Progressing   

## 2019-10-29 LAB — GASTROINTESTINAL PANEL BY PCR, STOOL (REPLACES STOOL CULTURE)

## 2019-10-29 LAB — COMPREHENSIVE METABOLIC PANEL
ALT: 9 U/L (ref 0–44)
AST: 11 U/L — ABNORMAL LOW (ref 15–41)
Albumin: 2.7 g/dL — ABNORMAL LOW (ref 3.5–5.0)
Alkaline Phosphatase: 58 U/L (ref 38–126)
Anion gap: 8 (ref 5–15)
BUN: 17 mg/dL (ref 6–20)
CO2: 21 mmol/L — ABNORMAL LOW (ref 22–32)
Calcium: 8.4 mg/dL — ABNORMAL LOW (ref 8.9–10.3)
Chloride: 115 mmol/L — ABNORMAL HIGH (ref 98–111)
Creatinine, Ser: 1.68 mg/dL — ABNORMAL HIGH (ref 0.44–1.00)
GFR calc Af Amer: 43 mL/min — ABNORMAL LOW (ref >60)
GFR calc non Af Amer: 37 mL/min — ABNORMAL LOW (ref >60)
Glucose, Bld: 101 mg/dL — ABNORMAL HIGH (ref 70–99)
Potassium: 3.4 mmol/L — ABNORMAL LOW (ref 3.5–5.1)
Sodium: 144 mmol/L (ref 135–145)
Total Bilirubin: 0.3 mg/dL (ref 0.3–1.2)
Total Protein: 5.2 g/dL — ABNORMAL LOW (ref 6.5–8.1)

## 2019-10-29 LAB — CBC
HCT: 22 % — ABNORMAL LOW (ref 36.0–46.0)
Hemoglobin: 7.3 g/dL — ABNORMAL LOW (ref 12.0–15.0)
MCH: 32.4 pg (ref 26.0–34.0)
MCHC: 33.2 g/dL (ref 30.0–36.0)
MCV: 97.8 fL (ref 80.0–100.0)
Platelets: 234 10*3/uL (ref 150–400)
RBC: 2.25 MIL/uL — ABNORMAL LOW (ref 3.87–5.11)
RDW: 12.6 % (ref 11.5–15.5)
WBC: 7.4 10*3/uL (ref 4.0–10.5)
nRBC: 0 % (ref 0.0–0.2)

## 2019-10-29 LAB — PHOSPHORUS: Phosphorus: 2.2 mg/dL — ABNORMAL LOW (ref 2.5–4.6)

## 2019-10-29 LAB — HEMOGLOBIN AND HEMATOCRIT, BLOOD
HCT: 26.5 % — ABNORMAL LOW (ref 36.0–46.0)
Hemoglobin: 8.9 g/dL — ABNORMAL LOW (ref 12.0–15.0)

## 2019-10-29 LAB — PREPARE RBC (CROSSMATCH)

## 2019-10-29 MED ORDER — K PHOS MONO-SOD PHOS DI & MONO 155-852-130 MG PO TABS
250.0000 mg | ORAL_TABLET | Freq: Two times a day (BID) | ORAL | Status: DC
  Administered 2019-10-29: 250 mg via ORAL
  Filled 2019-10-29 (×2): qty 1

## 2019-10-29 MED ORDER — POTASSIUM CHLORIDE CRYS ER 20 MEQ PO TBCR
40.0000 meq | EXTENDED_RELEASE_TABLET | Freq: Once | ORAL | Status: AC
  Administered 2019-10-29: 40 meq via ORAL
  Filled 2019-10-29: qty 2

## 2019-10-29 MED ORDER — AMOXICILLIN-POT CLAVULANATE 875-125 MG PO TABS: 1.0000 | ORAL_TABLET | Freq: Two times a day (BID) | ORAL | 0 refills | Status: AC

## 2019-10-29 MED ORDER — PANTOPRAZOLE SODIUM 40 MG PO TBEC: 40.0000 mg | DELAYED_RELEASE_TABLET | Freq: Every day | ORAL | 0 refills | Status: DC

## 2019-10-29 MED ORDER — POTASSIUM CHLORIDE 10 MEQ/100ML IV SOLN
10.0000 meq | INTRAVENOUS | Status: AC
  Administered 2019-10-29 (×2): 10 meq via INTRAVENOUS
  Filled 2019-10-29 (×2): qty 100

## 2019-10-29 MED ORDER — ONDANSETRON HCL 4 MG/2ML IJ SOLN: 4.0000 mg | Freq: Four times a day (QID) | INTRAMUSCULAR | 0 refills | Status: DC | PRN

## 2019-10-29 MED ORDER — SODIUM CHLORIDE 0.9% IV SOLUTION: Freq: Once | INTRAVENOUS | Status: AC

## 2019-10-29 MED ORDER — ONDANSETRON 4 MG PO TBDP
4.0000 mg | ORAL_TABLET | Freq: Three times a day (TID) | ORAL | 0 refills | Status: DC | PRN
Start: 2019-10-29 — End: 2024-04-04

## 2019-10-29 MED ORDER — CYANOCOBALAMIN 1000 MCG/ML IJ SOLN
1000.0000 ug | Freq: Once | INTRAMUSCULAR | Status: AC
  Administered 2019-10-29: 1000 ug via INTRAMUSCULAR
  Filled 2019-10-29: qty 1

## 2019-10-29 MED ORDER — POTASSIUM CHLORIDE 10 MEQ/100ML IV SOLN
10.0000 meq | Freq: Once | INTRAVENOUS | Status: AC
  Administered 2019-10-29: 10 meq via INTRAVENOUS
  Filled 2019-10-29: qty 100

## 2019-10-29 NOTE — Progress Notes (Signed)
DISCHARGE NOTE HOME Isabel [name] to be discharged Home per MD order. Discussed prescriptions and follow up appointments with the patient. Prescriptions given to patient; medication list explained in detail. Patient verbalized understanding.  Skin clean, dry and intact without evidence of skin break down, no evidence of skin tears noted. IV catheter discontinued intact. Site without signs and symptoms of complications. Dressing and pressure applied. Pt denies pain at the site currently. No complaints noted.  Patient free of lines, drains, and wounds.   An After Visit Summary (AVS) was printed and given to the patient. Patient escorted via wheelchair, and discharged home via private auto.  Selina Cooley BSN, RN3

## 2019-10-29 NOTE — ED Provider Notes (Signed)
[facility]   [id number] 10/26/19 Arrival Time: 1444  CC: ABDOMINAL PAIN  SUBJECTIVE:  Isabel [name] is a 44 y.o. female who presents with complaint of abdominal discomfort that began gradually 7 days ago. Denies a precipitating event, trauma, close contacts with similar symptoms, recent travel or antibiotic use. Reports that she has been having black stools all week as well as vomiting up dark brown and black fluid. Reports that she has not been able to take her medications for the last 3 days. She is concerned that her BP is may be elevated. Denies alleviating or aggravating factors. Denies similar symptoms in the past.  Last BM today.    Denies fever, chills, appetite changes, weight changes, chest pain, SOB, diarrhea, constipation, hematochezia, dysuria, difficulty urinating, increased frequency or urgency, flank pain, loss of bowel or bladder function, vaginal discharge, vaginal odor, vaginal bleeding, dyspareunia, pelvic pain.     No LMP recorded. Patient is perimenopausal.  ROS: As per HPI.  All other pertinent ROS negative.     Past Medical History:  Diagnosis Date  . CAD (coronary artery disease)   . Hyperlipidemia LDL goal <70   . Hypertension   . NSTEMI (non-ST elevated myocardial infarction) (HCC) 12/19/2018  . Tobacco abuse    Past Surgical History:  Procedure Laterality Date  . CESAREAN SECTION    . CHOLECYSTECTOMY    . CORONARY STENT INTERVENTION N/A 12/20/2018   Procedure: CORONARY STENT INTERVENTION;  Surgeon: Runell Gess, MD;  Location: [facility] INVASIVE CV LAB;  Service: Cardiovascular;  Laterality: N/A;  . LEFT HEART CATH AND CORONARY ANGIOGRAPHY N/A 12/20/2018   Procedure: LEFT HEART CATH AND CORONARY ANGIOGRAPHY;  Surgeon: Runell Gess, MD;  Location: [facility] CV [facility];  Service: Cardiovascular;  Laterality: N/A;   No Known Allergies No current facility-administered medications on file prior to encounter.   Current Outpatient Medications on  File Prior to Encounter  Medication Sig Dispense Refill  . amLODipine (NORVASC) 10 MG tablet Take 1 tablet (10 mg total) by mouth daily. 180 tablet 3  . aspirin 81 MG chewable tablet Chew 1 tablet (81 mg total) by mouth daily.    . carvedilol (COREG) 12.5 MG tablet Take 1 tablet (12.5 mg total) by mouth 2 (two) times daily. (Patient not taking: Reported on 10/26/2019) 60 tablet 3  . Evolocumab (REPATHA SURECLICK) 140 MG/ML SOAJ Inject 140 mg into the skin See admin instructions. Inject 140 mg subcutaneously on the 1st and 15th of each month    . nitroGLYCERIN (NITROSTAT) 0.4 MG SL tablet Place 1 tablet (0.4 mg total) under the tongue every 5 (five) minutes x 3 doses as needed for chest pain. 25 tablet 3  . spironolactone (ALDACTONE) 25 MG tablet Take 1 tablet (25 mg total) by mouth 2 (two) times daily. Please make follow up appt with Dr. Tenny Craw for refills. [phone]. 60 tablet 5  . ticagrelor (BRILINTA) 90 MG TABS tablet Take 1 tablet (90 mg total) by mouth 2 (two) times daily. 180 tablet 3   Social History   Socioeconomic History  . Marital status: Married    Spouse name: Not on file  . Number of children: Not on file  . Years of education: Not on file  . Highest education level: Not on file  Occupational History  . 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 Smoking status: Light Tobacco Smoker    Packs/day: 0.25    Types: Cigarettes  . Smokeless tobacco: Never  Used  . Tobacco comment: down to 1 cigarrettes per day  Substance and Sexual Activity  . Alcohol use: Never  . Drug use: Never  . Sexual activity: Not Currently  Other Topics Concern  . Not on file  Social History Narrative   Lives with husband and 4 sons   Social Determinants of Health   Financial Resource Strain:   . Difficulty of Paying Living Expenses:   Food Insecurity:   . Worried About Charity fundraiser in the Last Year:   . Arboriculturist in the Last Year:   Transportation Needs:   . Lexicographer (Medical):   Marland Kitchen Lack of Transportation (Non-Medical):   Physical Activity:   . Days of Exercise per Week:   . Minutes of Exercise per Session:   Stress:   . Feeling of Stress :   Social Connections:   . Frequency of Communication with Friends and Family:   . Frequency of Social Gatherings with Friends and Family:   . Attends Religious Services:   . Active Member of Clubs or Organizations:   . Attends Archivist Meetings:   Marland Kitchen Marital Status:   Intimate Partner Violence:   . Fear of Current or Ex-Partner:   . Emotionally Abused:   Marland Kitchen Physically Abused:   . Sexually Abused:    Family History  Problem Relation Age of Onset  . Heart attack Mother 23  . Hypertension Mother   . Hypertension Father   . Heart attack Maternal Grandmother 55  . Diabetes Maternal Grandmother   . Hypertension Sister   . Hypertension Paternal Grandmother   . Breast cancer Paternal Grandmother   . Diabetes Paternal Grandmother   . Cervical cancer Paternal Grandmother   . Heart attack Paternal Grandfather 74  . Hypertension Paternal Grandfather      OBJECTIVE:  Vitals:   10/26/19 1527  BP: (!) 94/39  Pulse: (!) 114  Resp: 20  Temp: 97.9 F (36.6 C)  SpO2: 98%    General appearance: Alert; ill-appearing, pallor HEENT: NCAT.  Oropharynx clear.  Lungs: clear to auscultation bilaterally without adventitious breath sounds Heart: regular rate and rhythm.  Radial pulses 2+ symmetrical bilaterally Abdomen: soft, non-distended; normal active bowel sounds; non-tender to light and deep palpation; nontender at McBurney's point; negative Murphy's sign; negative rebound; no guarding Back: no CVA tenderness Extremities: no edema; symmetrical with no gross deformities Skin: warm and dry Neurologic: normal gait Psychological: alert and cooperative; normal mood and affect  LABS: No results found for this or any previous visit (from the past 24 hour(s)).  DIAGNOSTIC STUDIES: No  results found.   ASSESSMENT & PLAN:  1. Other fatigue   2. Hypotension, unspecified hypotension type   3. Melena   4. Nausea and vomiting, intractability of vomiting not specified, unspecified vomiting type   5. Tachycardia     Meds ordered this encounter  Medications  . 0.9 %  sodium chloride infusion    Hypotension Melena Vomiting Diarrhea Tachycardia  Hypotensive, tachycardic, concerned for volemic shock IV started, fluids started Report to EMS and pt to ER via EMS for further evaluation and treatment      Faustino Congress, NP 10/29/19 0831

## 2019-10-29 NOTE — Progress Notes (Signed)
Patients Hgb after 1 unit of PRBC's is 8.9. MD notified. MD stated that it was ok to discharge patient.

## 2019-10-29 NOTE — TOC Initial Note (Signed)
Transition of Care [facility]) - Initial/Assessment Note    Patient Details  Name: Isabel [name] MRN: [id number] Date of Birth: 03/13/1976  Transition of Care [facility]) CM/SW Contact:    Bess Kinds, RN Phone Number: [phone] 10/29/2019, 1:31 PM  Clinical Narrative:                  Spoke with patient at the bedside. PTA home with spouse and children. Discussed Medicaid pending and PCP needs. Advised of hospital follow up appointment scheduled with [facility] on 6/17 9:30am which can be viewed on AVS. Patient agreeable to this follow up appointment. No problems with transportation for medical appointments or time of discharge. TOC following for transition needs.   Expected Discharge Plan: Home/Self Care Barriers to Discharge: Continued Medical Work up   Patient Goals and CMS Choice Patient states their goals for this hospitalization and ongoing recovery are:: home with family CMS Medicare.gov Compare Post Acute Care list provided to:: Patient Choice offered to / list presented to : NA  Expected Discharge Plan and Services Expected Discharge Plan: Home/Self Care In-house Referral: PCP / [facility] Discharge Planning Services: CM Consult Post Acute Care Choice: NA Living arrangements for the past 2 months: Single Family Home                 DME Arranged: N/A DME Agency: NA       HH Arranged: NA HH Agency: NA        Prior Living Arrangements/Services Living arrangements for the past 2 months: Single Family Home Lives with:: Self, Minor Children, Spouse Patient language and need for interpreter reviewed:: Yes Do you feel safe going back to the place where you live?: Yes      Need for Family Participation in Patient Care: Yes (Comment) Care giver support system in place?: Yes (comment)   Criminal Activity/Legal Involvement Pertinent to Current Situation/Hospitalization: No - Comment as needed  Activities of Daily Living Home Assistive Devices/Equipment: None ADL Screening  (condition at time of admission) Patient's cognitive ability adequate to safely complete daily activities?: Yes Is the patient deaf or have difficulty hearing?: No Does the patient have difficulty seeing, even when wearing glasses/contacts?: No Does the patient have difficulty concentrating, remembering, or making decisions?: No Patient able to express need for assistance with ADLs?: Yes Does the patient have difficulty dressing or bathing?: No Independently performs ADLs?: Yes (appropriate for developmental age) Does the patient have difficulty walking or climbing stairs?: No Weakness of Legs: None Weakness of Arms/Hands: None  Permission Sought/Granted                  Emotional Assessment Appearance:: Appears stated age Attitude/Demeanor/Rapport: Engaged Affect (typically observed): Accepting Orientation: : Oriented to Self, Oriented to  Time, Oriented to Place, Oriented to Situation Alcohol / Substance Use: Not Applicable Psych Involvement: No (comment)  Admission diagnosis:  Dehydration [E86.0] Acute renal injury (HCC) [N17.9] Acute renal failure, unspecified acute renal failure type [facility]) [N17.9] Patient Active Problem List   Diagnosis Date Noted  . Acute anemia   . Diarrhea   . Dental abscess   . Acute renal injury (HCC) 10/26/2019  . 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 Tobacco abuse 01/20/2019  . CAD (coronary artery disease) 12/21/2018  . Hyperlipidemia 12/21/2018  . Hypokalemia 12/21/2018  . NSTEMI (non-ST elevated myocardial infarction) (HCC) 12/19/2018  . Hypertension    PCP:  Patient, No Pcp Per Pharmacy:   [facility] [phone] - [place], [place] - [phone] [place]  [address] [place] [id number] [place] [address] [place] [address] Phone: [phone] Fax: [phone]     Social Determinants of Health (SDOH) Interventions    Readmission Risk Interventions No flowsheet data found.

## 2019-10-29 NOTE — Discharge Instructions (Addendum)
ER via EMS for further evaluation and treatment.

## 2019-10-29 NOTE — Progress Notes (Addendum)
HD#3 Subjective:  Overnight Events: continues to have nausea and diarrhea.  [place] patient resting comfortably in bed.  She states that she is tolerating solid food.  She has had some diarrhea overnight with some nausea but no vomiting.  Patient states that she is tolerating soft diet.  I counseled her on advancing her diet as needed and treating her symptoms diarrhea and nausea.   Objective:  Vital signs in last 24 hours: Vitals:   10/28/19 0948 10/28/19 1700 10/28/19 2047 10/29/19 0452  BP: 126/76 120/67 121/60 105/82  Pulse: 84 79 77 75  Resp: 18 18 18 18   Temp: 99.3 F (37.4 C) 99.3 F (37.4 C) 99 F (37.2 C) 98.9 F (37.2 C)  TempSrc: Oral Oral Oral   SpO2: 100% 100% 99% 100%  Weight:      Height:       Supplemental O2: RA SpO2: 100 %   Physical Exam:  Physical Exam  Constitutional: She is oriented to person, place, and time and well-developed, well-nourished, and in no distress.  HENT:  Head: Normocephalic and atraumatic.  Eyes: EOM are normal.  Cardiovascular: Normal rate and intact distal pulses. Exam reveals no gallop and no friction rub.  No murmur heard. Pulmonary/Chest: Effort normal and breath sounds normal. No respiratory distress.  Abdominal: Soft. Bowel sounds are normal. She exhibits no distension. There is no abdominal tenderness.  Musculoskeletal:        General: No tenderness or edema. Normal range of motion.  Neurological: She is alert and oriented to person, place, and time.  Skin: Skin is warm and dry.    Filed Weights   10/26/19 2315 10/27/19 2050  Weight: 84.5 kg 85.7 kg     Intake/Output Summary (Last 24 hours) at 10/29/2019 0647 Last data filed at 10/29/2019 0640 Gross per 24 hour  Intake 3277.76 ml  Output 1800 ml  Net 1477.76 ml   Net IO Since Admission: 4,683.46 mL [10/29/19 0647]  Pertinent Labs: CBC Latest Ref Rng & Units 10/29/2019 10/28/2019 10/27/2019  WBC 4.0 - 10.5 K/uL 7.4 9.4 11.8(H)  Hemoglobin 12.0 - 15.0  g/dL 7.3(L) 7.5(L) 8.0(L)  Hematocrit 36.0 - 46.0 % 22.0(L) 22.2(L) 25.7(L)  Platelets 150 - 400 K/uL 234 230 243    CMP Latest Ref Rng & Units 10/29/2019 10/28/2019 10/27/2019  Glucose 70 - 99 mg/dL 101(H) 105(H) 93  BUN 6 - 20 mg/dL 17 35(H) 52(H)  Creatinine 0.44 - 1.00 mg/dL 1.68(H) 2.46(H) 5.02(H)  Sodium 135 - 145 mmol/L 144 141 138  Potassium 3.5 - 5.1 mmol/L 3.4(L) 2.7(LL) 4.0  Chloride 98 - 111 mmol/L 115(H) 113(H) 119(H)  CO2 22 - 32 mmol/L 21(L) 20(L) 9(L)  Calcium 8.9 - 10.3 mg/dL 8.4(L) 7.8(L) 7.9(L)  Total Protein 6.5 - 8.1 g/dL 5.2(L) - 5.1(L)  Total Bilirubin 0.3 - 1.2 mg/dL 0.3 - 0.6  Alkaline Phos 38 - 126 U/L 58 - 65  AST 15 - 41 U/L 11(L) - 8(L)  ALT 0 - 44 U/L 9 - 10    Pending Labs: None  Imaging: No results found.   Assessment/Plan:   Principal Problem:   Acute renal injury ([place]) Active Problems:   Hypertension   CAD (coronary artery disease)   Hyperlipidemia   Acute anemia   Diarrhea   Dental abscess    has a past medical history of CAD (coronary artery disease), Hyperlipidemia LDL goal <70, Hypertension, NSTEMI (non-ST elevated myocardial infarction) ([place]) (12/19/2018), and Tobacco abuse.  Patient Summary:  Isabel [name] is a 44 y.o. with a pertinent PMH of CAD (coronary artery disease), Hyperlipidemia LDL goal <70, Hypertension, NSTEMI (non-ST elevated myocardial infarction) (HCC) (12/19/2018), and Tobacco abuse, who presented with diarrhea and vomitting and admitted for acute on CKD. He/she is on hospital day 3 and making improvements.  #Gastroenteritis, unknown etiology - Patient continues to have watery diarrhea with nausea and vomiting. - GI panel pending for infectious causes of diarrhea. - Continue to manage symptoms and replace insensible losses. - Increase diet to Soft diet.  Tolerating it well likely discharge today.  Acute on chronic kidney failure - Patient kidney function is close to baseline.   Normocytic anemia,  hypoproliferative - Hemoglobin of 7.3 today.  She does have a history of cardiovascular disease and therefore we will transfuse her with 1 unit prior to discharge today. - May need B12 supplementation. Methylmalonic acid pending - She will need outpatient follow up for her anemia.   Dental abscess: -Continue Augmentin 875-125.  Patient is on day 2 of 5  for oral abscess. she will need -She will need outpatient follow-up with dentistry/oral surgery.   Diet: Soft, advance as tolerated IVF: LR,. VTE: Heparin Code: Full PT/OT recs: None TOC recs: None   Dispo: Anticipated discharge today.    Dellia Cloud, D.O. [facility], PGY-1 Date 10/29/2019 Time 6:47 AM Pager: [phone]

## 2019-10-30 LAB — KAPPA/LAMBDA LIGHT CHAINS
Kappa free light chain: 33.8 mg/L — ABNORMAL HIGH (ref 3.3–19.4)
Kappa, lambda light chain ratio: 0.85 (ref 0.26–1.65)
Lambda free light chains: 39.8 mg/L — ABNORMAL HIGH (ref 5.7–26.3)

## 2019-10-30 LAB — TYPE AND SCREEN
ABO/RH(D): A POS
Antibody Screen: NEGATIVE
Unit division: 0

## 2019-10-30 LAB — BPAM RBC
Blood Product Expiration Date: 202106232359
ISSUE DATE / TIME: 202106021240
Unit Type and Rh: 6200

## 2019-11-01 LAB — METHYLMALONIC ACID, SERUM: Methylmalonic Acid, Quantitative: 125 nmol/L (ref 0–378)

## 2019-11-02 LAB — CULTURE, BLOOD (ROUTINE X 2)
Culture: NO GROWTH
Culture: NO GROWTH
Special Requests: ADEQUATE
Special Requests: ADEQUATE

## 2019-11-03 ENCOUNTER — Telehealth: Payer: Self-pay | Admitting: Internal Medicine

## 2019-11-03 LAB — IMMUNOFIXATION ELECTROPHORESIS
IgA: 262 mg/dL (ref 87–352)
IgG (Immunoglobin G), Serum: 761 mg/dL (ref 586–1602)
IgM (Immunoglobulin M), Srm: 21 mg/dL — ABNORMAL LOW (ref 26–217)
Total Protein ELP: 5.3 g/dL — ABNORMAL LOW (ref 6.0–8.5)

## 2019-11-03 NOTE — Telephone Encounter (Signed)
Pt was recently admitted for GI illness   She really needs f/u in internal medicine

## 2019-11-03 NOTE — Telephone Encounter (Signed)
Patient was discharged from the hospital on 6/2 she is calling for a hospital follow up appt, I did not find any available appts.

## 2019-11-05 NOTE — Telephone Encounter (Signed)
Patient is overdue for cardiology follow up.  Last seen July 2020 w recommended follow up in 6 months.

## 2019-11-05 NOTE — Telephone Encounter (Signed)
Patient scheduled for 6/22 at 840am with Dr. Tenny Craw. Patient is aware of appt date and time.

## 2019-11-05 NOTE — Telephone Encounter (Signed)
Appointment spot held for patient for 11/18/19.  Scheduling to reach out to patient.

## 2019-11-09 NOTE — Discharge Summary (Signed)
Name: Isabel [name] MRN: [id number] DOB: 09/13/1975 44 y.o. PCP: Patient, No Pcp Per  Date of Admission: 10/26/2019  4:05 PM Date of Discharge: 10/29/2019 Attending Physician: Dr. Heide Spark  Discharge Diagnosis: 1. Gastroenteritis, unknown etiology 2. Acute on chronic kidney failure 3. Normocytic anemia, hypoproliferative 4. Dental abscess:   Discharge Medications: Allergies as of 10/29/2019   No Known Allergies     Medication List    TAKE these medications   acetaminophen 500 MG tablet Commonly known as: TYLENOL Take 1,000 mg by mouth every 6 (six) hours as needed for headache (pain).   amLODipine 10 MG tablet Commonly known as: NORVASC Take 1 tablet (10 mg total) by mouth daily.   aspirin 81 MG chewable tablet Chew 1 tablet (81 mg total) by mouth daily.   carvedilol 12.5 MG tablet Commonly known as: COREG Take 1 tablet (12.5 mg total) by mouth 2 (two) times daily.   ibuprofen 200 MG tablet Commonly known as: ADVIL Take 600 mg by mouth every 6 (six) hours as needed (pain).   lisinopril 40 MG tablet Commonly known as: ZESTRIL Take 20 mg by mouth 2 (two) times daily.   nitroGLYCERIN 0.4 MG SL tablet Commonly known as: NITROSTAT Place 1 tablet (0.4 mg total) under the tongue every 5 (five) minutes x 3 doses as needed for chest pain.   ondansetron 4 MG disintegrating tablet Commonly known as: Zofran ODT Take 1 tablet (4 mg total) by mouth every 8 (eight) hours as needed for nausea or vomiting.   pantoprazole 40 MG tablet Commonly known as: PROTONIX Take 1 tablet (40 mg total) by mouth daily.   Repatha SureClick 140 MG/ML Soaj Generic drug: Evolocumab Inject 140 mg into the skin See admin instructions. Inject 140 mg subcutaneously on the 1st and 15th of each month   spironolactone 25 MG tablet Commonly known as: ALDACTONE Take 1 tablet (25 mg total) by mouth 2 (two) times daily. Please make follow up appt with Dr. Tenny Craw for refills. [phone].     ticagrelor 90 MG Tabs tablet Commonly known as: BRILINTA Take 1 tablet (90 mg total) by mouth 2 (two) times daily.     ASK your doctor about these medications   amoxicillin-clavulanate 875-125 MG tablet Commonly known as: AUGMENTIN Take 1 tablet by mouth every 12 (twelve) hours for 3 days. Ask about: Should I take this medication?       Disposition and follow-up:   Isabel [name] was discharged from [facility] in Stable condition.  At the hospital follow up visit please address:  1.  Follow-up:  A. Gastritis - make sure patient is tolerating PO intake and N/N/D has resolved. B. Acute on CKD - make sure renal function is returning to baseline  C. Anemia - she will need full work up for anemia in the OP setting. D. Dental Abscess - sent home with the remainder of her course of Augmentin (3 more days, for a total of 5), make sure she followed up at a dentist/oral surgeon.  E. Microscopic hematuria - repeat UA and further investigation in the outpatient setting.   2.  Labs / imaging needed at time of follow-up: CBC, BMP  3.  Pending labs/ test needing follow-up: methylmalonic acid  Follow-up Appointments:  Follow-up Information    Pricilla Riffle, MD. Schedule an appointment as soon as possible for a visit in 1 week(s).   Specialty: Cardiology Contact information: [address] [place] [address] [phone]  Hospital Course by problem list: Isabel [name] is a 44 year old female with past medical history of HTN, HLD, CAD with NSTEMI  11/2018 status post stent in 2020 who presents with approximately a week of vomiting and diarrhea  1. #Gastroenteritis, unknown etiology: presents with approximately a week of vomiting and diarrhea.  Patient reports her vomiting and diarrhea started a week ago and she has had a hard time keeping anything down. The symptoms come on with eating or not eating. She has noticed her vomit  and stools have been dark black colored. Denies any bright red blood. She has tried some Pepto-Bismol which did not help.  For the last 2 or 3 days she has not been able to keep medications down.  She denies any other sick contacts around her or anyone else experiencing these symptoms.  She has some left lower quadrant abdominal pain which is mild and comes on periodically. Unclear etiology of GI symptoms, but they improved with conservative management. GI infectious panel negative. Patient was able to tolerate PO intact prior to discharge.   Acute on chronic kidney failure Patient has significant acute kidney failure to be secondary to volume depletion in the setting of profuse nausea vomiting and diarrhea.  Presented with a creatinine of greater than 6.  Patient's kidney function improved with conservative management and fluids.  She will need follow-up in the outpatient setting to make sure her kidney function is completely resolve but her creatinine was close to baseline at discharge.  Normocytic anemia, hypoproliferative Patient presented with an anemia that was originally macrocytic and then became normocytic.  She does have a history of heart disease and has hemoglobin of less than 8 and therefore was transfused 1 unit packed red blood cells during this hospitalization.  Work-up seem to indicate anemia of chronic disease but she will need further evaluation in the outpatient setting for other causes.  Dental abscess: Patient was found to have dental abscess on admission.  There were no systemic symptoms of infection.  Patient was started on Augmentin in the hospital.  Neurosurgery was consulted and told her to follow-up in the outpatient setting.  She was discharged with 3 more days of Augmentin to complete a 5-day course was instructed to follow-up with the oral surgeon.  Discharge Vitals:   BP 124/68   Pulse 72   Temp 99.9 F (37.7 C) (Oral)   Resp 18   Ht 5\' 6"  (1.676 m)   Wt 85.7 kg    SpO2 96%   BMI 30.49 kg/m   Pertinent Labs, Studies, and Procedures:  CBC Latest Ref Rng & Units 10/29/2019 10/29/2019 10/28/2019  WBC 4.0 - 10.5 K/uL - 7.4 9.4  Hemoglobin 12.0 - 15.0 g/dL 8.9(L) 7.3(L) 7.5(L)  Hematocrit 36 - 46 % 26.5(L) 22.0(L) 22.2(L)  Platelets 150 - 400 K/uL - 234 230   CMP Latest Ref Rng & Units 10/29/2019 10/28/2019 10/27/2019  Glucose 70 - 99 mg/dL 101(H) 105(H) 93  BUN 6 - 20 mg/dL 17 35(H) 52(H)  Creatinine 0.44 - 1.00 mg/dL 1.68(H) 2.46(H) 5.02(H)  Sodium 135 - 145 mmol/L 144 141 138  Potassium 3.5 - 5.1 mmol/L 3.4(L) 2.7(LL) 4.0  Chloride 98 - 111 mmol/L 115(H) 113(H) 119(H)  CO2 22 - 32 mmol/L 21(L) 20(L) 9(L)  Calcium 8.9 - 10.3 mg/dL 8.4(L) 7.8(L) 7.9(L)  Total Protein 6.5 - 8.1 g/dL 5.2(L) - 5.1(L)  Total Bilirubin 0.3 - 1.2 mg/dL 0.3 - 0.6  Alkaline Phos 38 - 126 U/L 58 -  65  AST 15 - 41 U/L 11(L) - 8(L)  ALT 0 - 44 U/L 9 - 10     Discharge Instructions: Discharge Instructions    Diet - low sodium heart healthy   Complete by: As directed    Discharge instructions   Complete by: As directed    You were hospitalized for Kidney injury. Thank you for allowing [place] to be part of your care.   Please arrange follow up: 1. Primary care physician  Please note these changes made to your medications:   1. Please continue Augmentin. Please take 1 table every 12 hours for 3 days.   Please make sure to follow up with your doctors.  Please call our clinic if you have any questions or concerns, we may be able to help and keep you from a long and expensive emergency room wait. Our clinic and after hours phone number is [phone], the best time to call is Monday through Friday 9 am to 4 pm but there is always someone available 24/7 if you have an emergency. If you need medication refills please notify your pharmacy one week in advance and they will send [place] a request.   Increase activity slowly   Complete by: As directed       Signed: Dellia Cloud,  MD 11/09/2019, 10:05 AM   Pager: [phone]

## 2019-11-12 NOTE — Progress Notes (Signed)
Patient ID: Isabel Isabel [name], Isabel [name]   DOB: 1976-02-14, 44 y.o.   MRN: [id number]     Isabel Isabel [name], is a 44 y.o. Isabel [name]  JJO:[id number]  [id number]  DOB - [birth year]  Subjective:  Chief Complaint and HPI: Isabel Isabel [name] is a 44 y.o. Isabel [name] here today for a follow up visit  After hospitalization 5/30-10/29/2019.  She is feeling good.  Energy levels improving.  Appetite is good.  No further N/V/D.  Stools back to normal.  Abnormal kappa-lambda labs.  Seeing cardiology 11/18/2019.  No fevers.    From discharge summary: Hospital Course by problem list: Isabel Isabel [name] a 44 year old Isabel [name] with past medical history ofHTN, HLD, CAD with NSTEMI 11/2018 status post stent in 2020 who presents with approximately a week of vomiting and diarrhea  1. #Gastroenteritis, unknown etiology: presents with approximately a week of vomiting and diarrhea. Patient reports her vomiting and diarrhea started a week ago and she has had a hard time keeping anything down. The symptoms come on with eating or not eating. She has noticed her vomit and stools have been dark black colored. Denies any bright red blood.She has tried some Pepto-Bismol which did not help. For the last 2 or 3 days she has not been able to keep medications down. She denies any other sick contacts around her or anyone else experiencing thesesymptoms. She has some left lower quadrant abdominal pain which is mild and comes on periodically. Unclear etiology of GI symptoms, but they improved with conservative management. GI infectious panel negative. Patient was able to tolerate PO intact prior to discharge.   Acute on chronic kidney failure Patient has significant acute kidney failure to be secondary to volume depletion in the setting of profuse nausea vomiting and diarrhea.  Presented with a creatinine of greater than 6.  Patient's kidney function improved with conservative management and fluids.  She will need follow-up in the  outpatient setting to make sure her kidney function is completely resolve but her creatinine was close to baseline at discharge.  Normocytic anemia, hypoproliferative Patient presented with an anemia that was originally macrocytic and then became normocytic.  She does have a history of heart disease and has hemoglobin of less than 8 and therefore was transfused 1 unit packed red blood cells during this hospitalization.  Work-up seem to indicate anemia of chronic disease but she will need further evaluation in the outpatient setting for other causes.  Dental abscess: Patient was found to have dental abscess on admission.  There were no systemic symptoms of infection.  Patient was started on Augmentin in the hospital.  Neurosurgery was consulted and told her to follow-up in the outpatient setting.  She was discharged with 3 more days of Augmentin to complete a 5-day course was instructed to follow-up with the oral surgeon.  ED/Hospital notes reviewed.   Social History:  nurse  ROS:   Constitutional:  No f/c, No night sweats, No unexplained weight loss. EENT:  No vision changes, No blurry vision, No hearing changes. No mouth, throat, or ear problems.  Respiratory: No cough, No SOB Cardiac: No CP, no palpitations GI:  No abd pain, No N/V/D. GU: No Urinary s/sx Musculoskeletal: No joint pain Neuro: No headache, no dizziness, no motor weakness.  Skin: No rash Endocrine:  No polydipsia. No polyuria.  Psych: Denies SI/HI  No problems updated.  ALLERGIES: No Known Allergies  PAST MEDICAL HISTORY: Past Medical History:  Diagnosis Date  . CAD (coronary artery disease)   . Hyperlipidemia LDL  goal <70   . Hypertension   . NSTEMI (non-ST elevated myocardial infarction) (HCC) 12/19/2018  . Tobacco abuse     MEDICATIONS AT HOME: Prior to Admission medications   Medication Sig Start Date End Date Taking? Authorizing Provider  amLODipine (NORVASC) 10 MG tablet Take 1 tablet (10 mg total) by  mouth daily. 12/27/18  Yes Berton Bon, NP  aspirin 81 MG chewable tablet Chew 1 tablet (81 mg total) by mouth daily. 12/22/18  Yes Meng, Wynema Birch, PA  Evolocumab (REPATHA SURECLICK) 140 MG/ML SOAJ Inject 140 mg into the skin See admin instructions. Inject 140 mg subcutaneously on the 1st and 15th of each month   Yes [provider]  ibuprofen (ADVIL) 200 MG tablet Take 600 mg by mouth every 6 (six) hours as needed (pain).   Yes [provider]  lisinopril (ZESTRIL) 40 MG tablet Take 20 mg by mouth 2 (two) times daily.  09/26/19  Yes [provider]  nitroGLYCERIN (NITROSTAT) 0.4 MG SL tablet Place 1 tablet (0.4 mg total) under the tongue every 5 (five) minutes x 3 doses as needed for chest pain. 12/21/18  Yes Azalee Course, PA  ondansetron (ZOFRAN ODT) 4 MG disintegrating tablet Take 1 tablet (4 mg total) by mouth every 8 (eight) hours as needed for nausea or vomiting. 10/29/19  Yes Aslam, Leanna Sato, MD  pantoprazole (PROTONIX) 40 MG tablet Take 1 tablet (40 mg total) by mouth daily. 10/30/19 11/29/19 Yes Dellia Cloud, MD  spironolactone (ALDACTONE) 25 MG tablet Take 1 tablet (25 mg total) by mouth 2 (two) times daily. Please make follow up appt with Dr. Tenny Craw for refills. [phone]. 06/17/19  Yes Pricilla Riffle, MD  ticagrelor (BRILINTA) 90 MG TABS tablet Take 1 tablet (90 mg total) by mouth 2 (two) times daily. 12/21/18  Yes Azalee Course, PA  acetaminophen (TYLENOL) 500 MG tablet Take 1,000 mg by mouth every 6 (six) hours as needed for headache (pain). Patient not taking: Reported on 11/13/2019    [provider]  carvedilol (COREG) 12.5 MG tablet Take 1 tablet (12.5 mg total) by mouth 2 (two) times daily. Patient not taking: Reported on 10/26/2019 04/16/19 05/16/19  Pricilla Riffle, MD     Objective:  EXAM:   Vitals:   11/13/19 0929  BP: 121/68  Pulse: 82  Temp: 97.7 F (36.5 C)  TempSrc: Temporal  SpO2: 100%  Weight: 190 lb (86.2 kg)  Height: 5\' 7"  (1.702 m)    General  appearance : A&OX3. NAD. Non-toxic-appearing HEENT: Atraumatic and Normocephalic.  PERRLA. EOM intact.  TM clear B. Mouth-MMM, post pharynx WNL w/o erythema, No PND. Neck: supple, no JVD. No cervical lymphadenopathy. No thyromegaly Chest/Lungs:  Breathing-non-labored, Good air entry bilaterally, breath sounds normal without rales, rhonchi, or wheezing  CVS: S1 S2 regular, no murmurs, gallops, rubs  Abdomen: Bowel sounds present, Non tender and not distended with no gaurding, rigidity or rebound. Extremities: Bilateral Lower Ext shows no edema, both legs are warm to touch with = pulse throughout Neurology:  CN II-XII grossly intact, Non focal.   Psych:  TP linear. J/I WNL. Normal speech. Appropriate eye contact and affect.  Skin:  No Rash  Data Review Lab Results  Component Value Date   HGBA1C 5.1 12/19/2018     Assessment & Plan   1. Kappa light chain disease (HCC) +kappa/lambda in hospital with hypoproliferative normocytic anemia - Ambulatory referral to Hematology for work up  2. Acute renal injury (HCC) - Comprehensive metabolic panel  3.  Essential hypertension Controlled-continue current regimen - Comprehensive metabolic panel  4. Hypokalemia - Comprehensive metabolic panel  5. Hypophosphatemia Replace in hospital - Phosphorus  6. Hospital discharge follow-up Doing well overall.  Keep cardiology f/up 6/22  7. Anemia, unspecified type Not on iron;  S/p transfusion;  Periods not heavy or frequent since MI 1 year ago.  H/o BTL - CBC with Differential/Platelet - Iron, TIBC and Ferritin Panel   Patient have been counseled extensively about nutrition and exercise  Return in about 6 weeks (around 12/25/2019) for PCP; chronic conditions.  The patient was given clear instructions to go to ER or return to medical center if symptoms don't improve, worsen or new problems develop. The patient verbalized understanding. The patient was told to call to get lab results if they  haven't heard anything in the next week.     Freeman Caldron, PA-C [facility] [place], [place]   11/13/2019, 9:57 AM

## 2019-11-13 ENCOUNTER — Other Ambulatory Visit: Payer: Self-pay

## 2019-11-13 ENCOUNTER — Ambulatory Visit: Payer: Self-pay | Attending: Family Medicine | Admitting: Physician Assistant

## 2019-11-13 VITALS — BP 121/68 | HR 82 | Temp 97.7°F | Ht 67.0 in | Wt 190.0 lb

## 2019-11-13 DIAGNOSIS — D649 Anemia, unspecified: Secondary | ICD-10-CM

## 2019-11-13 DIAGNOSIS — Z09 Encounter for follow-up examination after completed treatment for conditions other than malignant neoplasm: Secondary | ICD-10-CM

## 2019-11-13 DIAGNOSIS — N179 Acute kidney failure, unspecified: Secondary | ICD-10-CM

## 2019-11-13 DIAGNOSIS — D8989 Other specified disorders involving the immune mechanism, not elsewhere classified: Secondary | ICD-10-CM

## 2019-11-13 DIAGNOSIS — E876 Hypokalemia: Secondary | ICD-10-CM

## 2019-11-13 DIAGNOSIS — I10 Essential (primary) hypertension: Secondary | ICD-10-CM

## 2019-11-14 LAB — IRON,TIBC AND FERRITIN PANEL
Ferritin: 98 ng/mL (ref 15–150)
Iron Saturation: 22 % (ref 15–55)
Iron: 49 ug/dL (ref 27–159)
Total Iron Binding Capacity: 226 ug/dL — ABNORMAL LOW (ref 250–450)
UIBC: 177 ug/dL (ref 131–425)

## 2019-11-14 LAB — COMPREHENSIVE METABOLIC PANEL
ALT: 11 IU/L (ref 0–32)
AST: 12 IU/L (ref 0–40)
Albumin/Globulin Ratio: 1.9 (ref 1.2–2.2)
Albumin: 4.5 g/dL (ref 3.8–4.8)
Alkaline Phosphatase: 90 IU/L (ref 48–121)
BUN/Creatinine Ratio: 12 (ref 9–23)
BUN: 24 mg/dL (ref 6–24)
Bilirubin Total: <0.2 mg/dL (ref 0.0–1.2)
CO2: 17 mmol/L — ABNORMAL LOW (ref 20–29)
Calcium: 9.6 mg/dL (ref 8.7–10.2)
Chloride: 106 mmol/L (ref 96–106)
Creatinine, Ser: 2.01 mg/dL — ABNORMAL HIGH (ref 0.57–1.00)
GFR calc Af Amer: 34 mL/min/{1.73_m2} — ABNORMAL LOW (ref >59)
GFR calc non Af Amer: 30 mL/min/{1.73_m2} — ABNORMAL LOW (ref >59)
Globulin, Total: 2.4 g/dL (ref 1.5–4.5)
Glucose: 95 mg/dL (ref 65–99)
Potassium: 5.7 mmol/L — ABNORMAL HIGH (ref 3.5–5.2)
Sodium: 137 mmol/L (ref 134–144)
Total Protein: 6.9 g/dL (ref 6.0–8.5)

## 2019-11-14 LAB — CBC WITH DIFFERENTIAL/PLATELET
Basophils Absolute: 0.1 10*3/uL (ref 0.0–0.2)
Basos: 1 %
EOS (ABSOLUTE): 0.4 10*3/uL (ref 0.0–0.4)
Eos: 4 %
Hematocrit: 36.5 % (ref 34.0–46.6)
Hemoglobin: 11.7 g/dL (ref 11.1–15.9)
Immature Grans (Abs): 0 10*3/uL (ref 0.0–0.1)
Immature Granulocytes: 0 %
Lymphocytes Absolute: 2.5 10*3/uL (ref 0.7–3.1)
Lymphs: 23 %
MCH: 31.4 pg (ref 26.6–33.0)
MCHC: 32.1 g/dL (ref 31.5–35.7)
MCV: 98 fL — ABNORMAL HIGH (ref 79–97)
Monocytes Absolute: 0.7 10*3/uL (ref 0.1–0.9)
Monocytes: 6 %
Neutrophils Absolute: 7.3 10*3/uL — ABNORMAL HIGH (ref 1.4–7.0)
Neutrophils: 66 %
Platelets: 338 10*3/uL (ref 150–450)
RBC: 3.73 x10E6/uL — ABNORMAL LOW (ref 3.77–5.28)
RDW: 12.6 % (ref 11.7–15.4)
WBC: 11 10*3/uL — ABNORMAL HIGH (ref 3.4–10.8)

## 2019-11-14 LAB — PHOSPHORUS: Phosphorus: 3.2 mg/dL (ref 3.0–4.3)

## 2019-11-16 NOTE — Progress Notes (Signed)
Cardiology Office Note   Date:  11/18/2019   ID:  Isabel [name], DOB 1976-05-29, MRN [id number]  PCP:  Charlott Rakes, MD  Cardiologist:   Dorris Carnes, MD   Pt presents for f/u of CAD    History of Present Illness: Isabel [name] is a 43 y.o. female with a history of HTN, HL, hypokalemia and CAD   She is s/p NSTEMI in 12/20/18:   80% prox RCA   She is s/p DES stent   Echo showed LVEF 60 to 65%  The pt was admitted into the hosp for a GI illness.  Creatinine on admission was 6.84 bicarb 11.  On admission her hemoglobin was 10.9 went down to 7 she was transfused.  Work-up for anemia and kidney disease was in progress.  This included a Bence-Jones measurement which was positive.  She is being set up to be seen in hematology.  Last creatinine was a few days ago at 2.  She has had problems with hyperkalemia.  The patient says her breathing overall is okay.  She denies chest pain.  Prior to the GI illness she had been doing okay.  Lipids in December were elevated she is now on Repatha.      Current Meds  Medication Sig  . acetaminophen (TYLENOL) 500 MG tablet Take 1,000 mg by mouth every 6 (six) hours as needed for headache (pain).   Marland Kitchen amLODipine (NORVASC) 10 MG tablet Take 1 tablet (10 mg total) by mouth daily.  Marland Kitchen aspirin 81 MG chewable tablet Chew 1 tablet (81 mg total) by mouth daily.  . carvedilol (COREG) 12.5 MG tablet Take 1 tablet (12.5 mg total) by mouth 2 (two) times daily.  . Evolocumab (REPATHA SURECLICK) 563 MG/ML SOAJ Inject 140 mg into the skin See admin instructions. Inject 140 mg subcutaneously on the 1st and 15th of each month  . ibuprofen (ADVIL) 200 MG tablet Take 600 mg by mouth every 6 (six) hours as needed (pain).  Marland Kitchen lisinopril (ZESTRIL) 40 MG tablet Take 20 mg by mouth 2 (two) times daily.   . nitroGLYCERIN (NITROSTAT) 0.4 MG SL tablet Place 1 tablet (0.4 mg total) under the tongue every 5 (five) minutes x 3 doses as needed for chest pain.  Marland Kitchen ondansetron  (ZOFRAN ODT) 4 MG disintegrating tablet Take 1 tablet (4 mg total) by mouth every 8 (eight) hours as needed for nausea or vomiting.  . pantoprazole (PROTONIX) 40 MG tablet Take 1 tablet (40 mg total) by mouth daily.  Marland Kitchen spironolactone (ALDACTONE) 25 MG tablet Take 1 tablet (25 mg total) by mouth 2 (two) times daily. Please make follow up appt with Dr. Harrington Challenger for refills. [phone].  Marland Kitchen ticagrelor (BRILINTA) 90 MG TABS tablet Take 1 tablet (90 mg total) by mouth 2 (two) times daily.     Allergies:   Patient has no known allergies.   Past Medical History:  Diagnosis Date  . CAD (coronary artery disease)   . Hyperlipidemia LDL goal <70   . Hypertension   . NSTEMI (non-ST elevated myocardial infarction) ([facility]) 12/19/2018  . Tobacco abuse     Past Surgical History:  Procedure Laterality Date  . CESAREAN SECTION    . CHOLECYSTECTOMY    . CORONARY STENT INTERVENTION N/A 12/20/2018   Procedure: CORONARY STENT INTERVENTION;  Surgeon: Lorretta Harp, MD;  Location: [facility];  Service: Cardiovascular;  Laterality: N/A;  . LEFT HEART CATH AND CORONARY ANGIOGRAPHY N/A 12/20/2018   Procedure: LEFT HEART  CATH AND CORONARY ANGIOGRAPHY;  Surgeon: Runell Gess, MD;  Location: [facility] INVASIVE CV LAB;  Service: Cardiovascular;  Laterality: N/A;     Social History:  The patient  reports that she has quit smoking. Her smoking use included cigarettes. She has a 2.50 pack-year smoking history. She has never used smokeless tobacco. She reports that she does not drink alcohol and does not use drugs.   Family History:  The patient's family history includes Breast cancer in her paternal grandmother; Cervical cancer in her paternal grandmother; Diabetes in her maternal grandmother and paternal grandmother; Heart attack (age of onset: 80) in her maternal grandmother; Heart attack (age of onset: 29) in her mother; Heart attack (age of onset: 64) in her paternal grandfather; Hypertension in her father,  mother, paternal grandfather, paternal grandmother, and sister.    ROS:  Please see the history of present illness. All other systems are reviewed and  Negative to the above problem except as noted.    PHYSICAL EXAM: VS:  BP (!) 108/58   Pulse 94   Ht 5\' 7"  (1.702 m)   Wt 190 lb 1.9 oz (86.2 kg)   SpO2 99%   BMI 29.78 kg/m   GEN: Well nourished, well developed, in no acute distress  HEENT: normal  Neck: no JVD, carotid bruits Cardiac: RRR; no murmurs, rubs, or gallops,no edema  Respiratory:  clear to auscultation bilaterally, normal work of breathing GI: soft, nontender, nondistended, + BS  No hepatomegaly  MS: no deformity Moving all extremities   Skin: warm and dry, no rash Neuro:  Strength and sensation are intact Psych: euthymic mood, full affect   EKG:  EKG is not ordered today.   Lipid Panel    Component Value Date/Time   CHOL 198 05/02/2019 0842   TRIG 278 (H) 05/02/2019 0842   HDL 27 (L) 05/02/2019 0842   CHOLHDL 7.3 (H) 05/02/2019 0842   CHOLHDL 4.3 12/19/2018 2354   VLDL 36 12/19/2018 2354   LDLCALC 122 (H) 05/02/2019 0842      Wt Readings from Last 3 Encounters:  11/18/19 190 lb 1.9 oz (86.2 kg)  11/13/19 190 lb (86.2 kg)  10/27/19 188 lb 15 oz (85.7 kg)      ASSESSMENT AND PLAN:  1.  CAD.  Doing well post stent in July 2020.  She had an 80% RCA lesion with which underwent drug-eluting stent placement.  Plan to continue Brilinta and aspirin until the end of July then back off to just aspirin  2.  Dyslipidemia.  She is now on Repatha.  Has not had her lipids checked.  We will set up today.  3 renal.  We will repeat be met.  Note she has had problems with hyperkalemia looking at her medicine she is on Aldactone as well as lisinopril may need to change this around.  Could simplify for sure.  LV functions normal  4.  Heme.  Transient anemia requiring transfusion.  Now now with light chains in the serum.  She is being due to be set up in hematology for  full evaluation.  CBC a few days ago was not bad.  Again she will be coming off Brilinta in the near future.  Follow-up in 6 months.   Current medicines are reviewed at length with the patient today.  The patient does not have concerns regarding medicines.  Signed, August, MD  11/18/2019 9:18 AM    [facility] [address] [place], [place], [place]  [address] Phone: [phone]; Fax: [phone]    

## 2019-11-18 ENCOUNTER — Ambulatory Visit (INDEPENDENT_AMBULATORY_CARE_PROVIDER_SITE_OTHER): Payer: Self-pay | Admitting: Internal Medicine

## 2019-11-18 ENCOUNTER — Other Ambulatory Visit: Payer: Self-pay

## 2019-11-18 ENCOUNTER — Encounter: Payer: Self-pay | Admitting: Internal Medicine

## 2019-11-18 VITALS — BP 108/58 | HR 94 | Ht 67.0 in | Wt 190.1 lb

## 2019-11-18 DIAGNOSIS — I2510 Atherosclerotic heart disease of native coronary artery without angina pectoris: Secondary | ICD-10-CM

## 2019-11-18 DIAGNOSIS — E782 Mixed hyperlipidemia: Secondary | ICD-10-CM

## 2019-11-18 LAB — BASIC METABOLIC PANEL
BUN/Creatinine Ratio: 12 (ref 9–23)
BUN: 21 mg/dL (ref 6–24)
CO2: 17 mmol/L — ABNORMAL LOW (ref 20–29)
Calcium: 9.5 mg/dL (ref 8.7–10.2)
Chloride: 107 mmol/L — ABNORMAL HIGH (ref 96–106)
Creatinine, Ser: 1.69 mg/dL — ABNORMAL HIGH (ref 0.57–1.00)
GFR calc Af Amer: 42 mL/min/{1.73_m2} — ABNORMAL LOW (ref >59)
GFR calc non Af Amer: 37 mL/min/{1.73_m2} — ABNORMAL LOW (ref >59)
Glucose: 99 mg/dL (ref 65–99)
Potassium: 4.9 mmol/L (ref 3.5–5.2)
Sodium: 137 mmol/L (ref 134–144)

## 2019-11-18 LAB — LIPID PANEL
Chol/HDL Ratio: 3.4 ratio (ref 0.0–4.4)
Cholesterol, Total: 118 mg/dL (ref 100–199)
HDL: 35 mg/dL — ABNORMAL LOW (ref >39)
LDL Chol Calc (NIH): 44 mg/dL (ref 0–99)
Triglycerides: 251 mg/dL — ABNORMAL HIGH (ref 0–149)
VLDL Cholesterol Cal: 39 mg/dL (ref 5–40)

## 2019-11-18 MED ORDER — TICAGRELOR 90 MG PO TABS: 90.0000 mg | ORAL_TABLET | Freq: Two times a day (BID) | ORAL | 3 refills | Status: AC

## 2019-11-18 NOTE — Patient Instructions (Addendum)
Medication Instructions:  ---STOP BRILINTA (ticagrelor) AFTER December 27, 2019  Continue on aspirin.  *If you need a refill on your cardiac medications before your next appointment, please call your pharmacy*   Lab Work: TODAY: LIPIDS, BMET  If you have labs (blood work) drawn today and your tests are completely normal, you will receive your results only by: Marland Kitchen MyChart Message (if you have MyChart) OR . A paper copy in the mail If you have any lab test that is abnormal or we need to change your treatment, we will call you to review the results.   Testing/Procedures: NONE   Follow-Up: At [facility], you and your health needs are our priority.  As part of our continuing mission to provide you with exceptional heart care, we have created designated Provider Care Teams.  These Care Teams include your primary Cardiologist (physician) and Advanced Practice Providers (APPs -  Physician Assistants and Nurse Practitioners) who all work together to provide you with the care you need, when you need it.  Your next appointment:   6 month(s)  The format for your next appointment:   In Person  Provider:   You may see Dietrich Pates, MD or one of the following Advanced Practice Providers on your designated Care Team:    Tereso Newcomer, PA-C  Chelsea Aus, [place]    Other Instructions

## 2019-11-20 ENCOUNTER — Telehealth: Payer: Self-pay | Admitting: *Deleted

## 2019-11-20 ENCOUNTER — Telehealth: Payer: Self-pay | Admitting: Hematology and Oncology

## 2019-11-20 DIAGNOSIS — I2510 Atherosclerotic heart disease of native coronary artery without angina pectoris: Secondary | ICD-10-CM

## 2019-11-20 MED ORDER — SPIRONOLACTONE 25 MG PO TABS
25.0000 mg | ORAL_TABLET | Freq: Every day | ORAL | 3 refills | Status: DC
Start: 2019-11-20 — End: 2020-12-31

## 2019-11-20 NOTE — Telephone Encounter (Signed)
Patient informed of lab results/recommendations. She will decrease spironolactone and come in for labs on 12/02/19.

## 2019-11-20 NOTE — Telephone Encounter (Signed)
-----   Message from Dietrich Pates V, MD sent at 11/19/2019  9:30 PM EDT ----- Kidney function is a little better than 1 wk ago  Cr is 1.69   Lpids ae excellent   I would cut back on spironolactone to 1x per day    REpeat BMET in 10 days

## 2019-11-20 NOTE — Telephone Encounter (Signed)
Received a new hem referral from [facility] for Kappa light chain disease. Isabel [name] has been cld and scheduled to see Dr. Leonides Schanz on 7/12 at 1pm. Pt aware to arrive 15 minutes early.

## 2019-12-02 ENCOUNTER — Other Ambulatory Visit: Payer: Self-pay

## 2019-12-02 ENCOUNTER — Other Ambulatory Visit: Payer: Self-pay | Admitting: Internal Medicine

## 2019-12-07 NOTE — Progress Notes (Signed)
Isabel [name] [phone]   Fax:([phone]) [facility] NOTE  Patient Care Team: Charlott Rakes, MD as PCP - General (Family Medicine) Fay Records, MD as PCP - Cardiology (Cardiology)  Hematological/Oncological History # Concern for Abnormalities in Kappa/Lambda Light Chains 1) 10/29/2019: Kappa 33.8, Lambda 39.8, ratio 0.85. IFE showed no monoclonal protein, Cr 1.68 2) 12/08/2019: establish care with Dr. Lorenso Courier   CHIEF COMPLAINTS/PURPOSE OF CONSULTATION:  "Abnormal Kappa/Lambda "  HISTORY OF PRESENTING ILLNESS:  Isabel [name] 44 y.o. female with medical history significant for CKD, CAD, HTN, and HLD who presents for evaluation of abnormalities in Kappa/Lambda Light Chains.   On review of the previous records was seen at [facility] by Freeman Caldron on 11/13/2019.  At that time it was noted that she had prior labs on 10/29/2019 which showed a kappa of 33.8, lambda of 39.8, and a ratio of 8.5.  IV the time showed no monoclonal protein the patient's creatinine was at 1.68 after having been elevated above the baseline at 2.46 the day prior.  Patient was also noted to have a hemoglobin of 8.9 on 6 2, but on recheck on 11/13/2019 the patient had rebounded to a hemoglobin of 11.7.  Due to concern for this patient's abnormal kappa lambda chains she was referred to hematology for further evaluation and management.  On exam today Isabel [name] notes that the primary symptom she has been experiencing lately has been fatigue.  She notes that she had a recent hospitalization in June 2021 during which time she had marked vomiting and diarrhea.  She had a severe drop in her hemoglobin which required blood transfusion as well as a marked worsening in her kidney function.  Within 2 weeks of admission to the hospital she had complete normalization of her hemoglobin with return to her baseline level of creatinine.  The etiology of this marked  swelling in her lab values was not clear based on the assessment of her physicians.  Additionally she notes that her blood pressure which was previously high had subsequently transitioned down to relatively low systolic blood pressures.  On further discussion Isabel [name] notes that she has no family history remarkable for hematological malignancies.  She also notes that she does not follow with a nephrologist and it is thought that her kidney function is elevated due to her spironolactone use.  Today her primary concern is the elevation in serum free light chains for which he was referred for further evaluation and management.  She currently denies having any issues with fevers, chills, sweats, nausea, vomiting or diarrhea.  A full 10 point ROS is listed below.  MEDICAL HISTORY:  Past Medical History:  Diagnosis Date   CAD (coronary artery disease)    Hyperlipidemia LDL goal <70    Hypertension    NSTEMI (non-ST elevated myocardial infarction) ([place]) 12/19/2018   Tobacco abuse     SURGICAL HISTORY: Past Surgical History:  Procedure Laterality Date   CESAREAN SECTION     CHOLECYSTECTOMY     CORONARY STENT INTERVENTION N/A 12/20/2018   Procedure: CORONARY STENT INTERVENTION;  Surgeon: Lorretta Harp, MD;  Location: [facility];  Service: Cardiovascular;  Laterality: N/A;   LEFT HEART CATH AND CORONARY ANGIOGRAPHY N/A 12/20/2018   Procedure: LEFT HEART CATH AND CORONARY ANGIOGRAPHY;  Surgeon: Lorretta Harp, MD;  Location: [facility];  Service: Cardiovascular;  Laterality: N/A;    SOCIAL HISTORY: Social History  Socioeconomic History   Marital status: Married    Spouse name: Not on file   Number of children: Not on file   Years of education: Not on file   Highest education level: Not on file  Occupational History   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  Tobacco Use   Smoking status: Former Smoker    Packs/day: 0.25    Years: 10.00    Pack years:  2.50    Types: Cigarettes   Smokeless tobacco: Never Used   Tobacco comment: down to 1 cigarrettes per day  Vaping Use   Vaping Use: Never used  Substance and Sexual Activity   Alcohol use: Never   Drug use: Never   Sexual activity: Not Currently  Other Topics Concern   Not on file  Social History Narrative   Lives with husband and 4 sons   Social Determinants of Health   Financial Resource Strain:    Difficulty of Paying Living Expenses:   Food Insecurity:    Worried About Charity fundraiser in the Last Year:    Arboriculturist in the Last Year:   Transportation Needs:    Film/video editor (Medical):    Lack of Transportation (Non-Medical):   Physical Activity:    Days of Exercise per Week:    Minutes of Exercise per Session:   Stress:    Feeling of Stress :   Social Connections:    Frequency of Communication with Friends and Family:    Frequency of Social Gatherings with Friends and Family:    Attends Religious Services:    Active Member of Clubs or Organizations:    Attends Music therapist:    Marital Status:   Intimate Partner Violence:    Fear of Current or Ex-Partner:    Emotionally Abused:    Physically Abused:    Sexually Abused:     FAMILY HISTORY: Family History  Problem Relation Age of Onset   Heart attack Mother 53   Hypertension Mother    Hypertension Father    Heart attack Maternal Grandmother 74   Diabetes Maternal Grandmother    Hypertension Sister    Hypertension Paternal Grandmother    Breast cancer Paternal Grandmother    Diabetes Paternal Grandmother    Cervical cancer Paternal Grandmother    Heart attack Paternal Grandfather 66   Hypertension Paternal Grandfather     ALLERGIES:  has No Known Allergies.  MEDICATIONS:  Current Outpatient Medications  Medication Sig Dispense Refill   acetaminophen (TYLENOL) 500 MG tablet Take 1,000 mg by mouth every 6 (six) hours as needed for  headache (pain).      amLODipine (NORVASC) 10 MG tablet Take 1 tablet (10 mg total) by mouth daily. 180 tablet 3   aspirin 81 MG chewable tablet Chew 1 tablet (81 mg total) by mouth daily.     Evolocumab (REPATHA SURECLICK) 161 MG/ML SOAJ Inject 140 mg into the skin See admin instructions. Inject 140 mg subcutaneously on the 1st and 15th of each month     ibuprofen (ADVIL) 200 MG tablet Take 600 mg by mouth every 6 (six) hours as needed (pain).     lisinopril (ZESTRIL) 40 MG tablet Take 20 mg by mouth 2 (two) times daily.      nitroGLYCERIN (NITROSTAT) 0.4 MG SL tablet Place 1 tablet (0.4 mg total) under the tongue every 5 (five) minutes x 3 doses as needed for chest pain. 25 tablet 3   ondansetron (ZOFRAN  ODT) 4 MG disintegrating tablet Take 1 tablet (4 mg total) by mouth every 8 (eight) hours as needed for nausea or vomiting. (Patient not taking: Reported on 12/08/2019) 20 tablet 0   pantoprazole (PROTONIX) 40 MG tablet Take 1 tablet (40 mg total) by mouth daily. 30 tablet 0   spironolactone (ALDACTONE) 25 MG tablet Take 1 tablet (25 mg total) by mouth daily. Dose decrease - please keep on file until next refill needed. 90 tablet 3   ticagrelor (BRILINTA) 90 MG TABS tablet Take 1 tablet (90 mg total) by mouth 2 (two) times daily. 180 tablet 3   No current facility-administered medications for this visit.    REVIEW OF SYSTEMS:   Constitutional: ( - ) fevers, ( - )  chills , ( - ) night sweats Eyes: ( - ) blurriness of vision, ( - ) double vision, ( - ) watery eyes Ears, nose, mouth, throat, and face: ( - ) mucositis, ( - ) sore throat Respiratory: ( - ) cough, ( - ) dyspnea, ( - ) wheezes Cardiovascular: ( - ) palpitation, ( - ) chest discomfort, ( - ) lower extremity swelling Gastrointestinal:  ( - ) nausea, ( - ) heartburn, ( - ) change in bowel habits Skin: ( - ) abnormal skin rashes Lymphatics: ( - ) new lymphadenopathy, ( - ) easy bruising Neurological: ( - ) numbness, ( - )  tingling, ( - ) new weaknesses Behavioral/Psych: ( - ) mood change, ( - ) new changes  All other systems were reviewed with the patient and are negative.  PHYSICAL EXAMINATION: ECOG PERFORMANCE STATUS: 1 - Symptomatic but completely ambulatory  Vitals:   12/08/19 1310  BP: (!) 99/52  Pulse: 83  Resp: 17  Temp: 97.6 F (36.4 C)  SpO2: 100%   Filed Weights   12/08/19 1310  Weight: 195 lb 4.8 oz (88.6 kg)    GENERAL: well appearing middle aged Caucasian female in NAD  SKIN: skin color, texture, turgor are normal, no rashes or significant lesions EYES: conjunctiva are pink and non-injected, sclera clear LUNGS: clear to auscultation and percussion with normal breathing effort HEART: regular rate & rhythm and no murmurs and no lower extremity edema Musculoskeletal: no cyanosis of digits and no clubbing  PSYCH: alert & oriented x 3, fluent speech NEURO: no focal motor/sensory deficits  LABORATORY DATA:  I have reviewed the data as listed CBC Latest Ref Rng & Units 11/13/2019 10/29/2019 10/29/2019  WBC 3.4 - 10.8 x10E3/uL 11.0(H) - 7.4  Hemoglobin 11.1 - 15.9 g/dL 11.7 8.9(L) 7.3(L)  Hematocrit 34.0 - 46.6 % 36.5 26.5(L) 22.0(L)  Platelets 150 - 450 x10E3/uL 338 - 234    CMP Latest Ref Rng & Units 11/18/2019 11/13/2019 10/29/2019  Glucose 65 - 99 mg/dL 99 95 101(H)  BUN 6 - 24 mg/dL '21 24 17  ' Creatinine 0.57 - 1.00 mg/dL 1.69(H) 2.01(H) 1.68(H)  Sodium 134 - 144 mmol/L 137 137 144  Potassium 3.5 - 5.2 mmol/L 4.9 5.7(H) 3.4(L)  Chloride 96 - 106 mmol/L 107(H) 106 115(H)  CO2 20 - 29 mmol/L 17(L) 17(L) 21(L)  Calcium 8.7 - 10.2 mg/dL 9.5 9.6 8.4(L)  Total Protein 6.0 - 8.5 g/dL - 6.9 5.2(L)  Total Bilirubin 0.0 - 1.2 mg/dL - <0.2 0.3  Alkaline Phos 48 - 121 IU/L - 90 58  AST 0 - 40 IU/L - 12 11(L)  ALT 0 - 32 IU/L - 11 9    RADIOGRAPHIC STUDIES: No results found.  ASSESSMENT &  PLAN Isabel [name] 44 y.o. female with medical history significant for CKD, CAD, HTN, and HLD who  presents for evaluation of abnormalities in Kappa/Lambda Light Chains.  At this time the reason for the patient's marked drop in hemoglobin and robust rebound as well as worsening kidney function with return to baseline so quickly is not entirely clear.  Is possible the patient had issues with hemolysis during that acute infection, though would be difficult to tell in retrospect what the etiology of these findings were.  Today we will order some labs to assess for continued hemolysis, but given her normalization of hemoglobin I doubt that these will yield much information.  In regards to her elevation in kappa and lambda light chains fortunately these are elevated proportional to each other and therefore the ratio is within normal limits.  It is common with acute kidney injury and chronic kidney disease to see equal elevations in kappa and lambda light chains.  Fortunately on IFE there was not noted to be any monoclonal component and therefore I have very low suspicion for a monoclonal gammopathy or a plasma cell dyscrasia.  We will effectively rule this out today by ordering an SPEP, UPEP, and serum free light chains as well as LDH and beta-2 microglobulin.  In the event there are any abnormalities we be happy to follow the patient as an MGUS follow-up, but at this time there is no indication for routine follow-up.  # Concern for Abnormalities in Kappa/Lambda Light Chains --findings are not concerning for a monoclonal gammopathy/plasma cell dyscrasia at this time and likely represent a balanced increase in both light chains from renal dysfunction.  --the absence of monoclonal protein is reassuring, though we will repeat SPEP and SFLC to assure no abnormalities --additionally will collect UPEP, LDH to assure no monoclonal protein in the urine.  --RTC pending the results of the above labs.   #Normocytic Anemia --Iron levels appears WNL on 11/13/2019 --normocytic anemia is likely 2/2 to renal dysfunction in  the setting of CKD --today will check CBC, retic panel to be certain she has maintained a normal level of Hgb --appears to have rebounded back to normal levels on 11/13/2019.    Orders Placed This Encounter  Procedures   CBC with Differential ([facility] Only)    Standing Status:   Future    Number of Occurrences:   1    Standing Expiration Date:   12/07/2020   Retic Panel    Standing Status:   Future    Number of Occurrences:   1    Standing Expiration Date:   12/07/2020   CMP ([facility] only)    Standing Status:   Future    Number of Occurrences:   1    Standing Expiration Date:   12/07/2020   Lactate dehydrogenase (LDH)    Standing Status:   Future    Number of Occurrences:   1    Standing Expiration Date:   12/07/2020   Multiple Myeloma Panel (SPEP&IFE w/QIG)    Standing Status:   Future    Number of Occurrences:   1    Standing Expiration Date:   12/07/2020   Kappa/lambda light chains    Standing Status:   Future    Number of Occurrences:   1    Standing Expiration Date:   12/07/2020   24-Hr Ur UPEP/UIFE/Light Chains/TP    Standing Status:   Future    Standing Expiration Date:   12/07/2020  Beta 2 microglobulin    Standing Status:   Future    Number of Occurrences:   1    Standing Expiration Date:   12/07/2020   Haptoglobin    Standing Status:   Future    Number of Occurrences:   1    Standing Expiration Date:   12/07/2020   Folate, Serum    Standing Status:   Future    Number of Occurrences:   1    Standing Expiration Date:   12/07/2020   Direct antiglobulin test ([place])    Standing Status:   Future    Number of Occurrences:   1    Standing Expiration Date:   12/07/2020    All questions were answered. The patient knows to call the clinic with any problems, questions or concerns.  A total of more than 45 minutes were spent on this encounter and over half of that time was spent on counseling and coordination of care as outlined above.   Ledell Peoples, MD Department of Hematology/Oncology [place] at [facility] Phone: [phone] Pager: [phone] Email: [email or web address]  12/08/2019 2:19 PM

## 2019-12-08 ENCOUNTER — Inpatient Hospital Stay: Payer: Self-pay | Attending: Hematology and Oncology | Admitting: Hematology and Oncology

## 2019-12-08 ENCOUNTER — Other Ambulatory Visit: Payer: Self-pay

## 2019-12-08 ENCOUNTER — Encounter: Payer: Self-pay | Admitting: Hematology and Oncology

## 2019-12-08 ENCOUNTER — Inpatient Hospital Stay: Payer: Self-pay

## 2019-12-08 VITALS — BP 99/52 | HR 83 | Temp 97.6°F | Resp 17 | Ht 67.0 in | Wt 195.3 lb

## 2019-12-08 DIAGNOSIS — I129 Hypertensive chronic kidney disease with stage 1 through stage 4 chronic kidney disease, or unspecified chronic kidney disease: Secondary | ICD-10-CM | POA: Insufficient documentation

## 2019-12-08 DIAGNOSIS — R768 Other specified abnormal immunological findings in serum: Secondary | ICD-10-CM | POA: Insufficient documentation

## 2019-12-08 DIAGNOSIS — Z808 Family history of malignant neoplasm of other organs or systems: Secondary | ICD-10-CM | POA: Insufficient documentation

## 2019-12-08 DIAGNOSIS — N179 Acute kidney failure, unspecified: Secondary | ICD-10-CM | POA: Insufficient documentation

## 2019-12-08 DIAGNOSIS — D649 Anemia, unspecified: Secondary | ICD-10-CM

## 2019-12-08 DIAGNOSIS — Z803 Family history of malignant neoplasm of breast: Secondary | ICD-10-CM | POA: Insufficient documentation

## 2019-12-08 DIAGNOSIS — I2510 Atherosclerotic heart disease of native coronary artery without angina pectoris: Secondary | ICD-10-CM | POA: Insufficient documentation

## 2019-12-08 DIAGNOSIS — Z87891 Personal history of nicotine dependence: Secondary | ICD-10-CM | POA: Insufficient documentation

## 2019-12-08 DIAGNOSIS — N189 Chronic kidney disease, unspecified: Secondary | ICD-10-CM | POA: Insufficient documentation

## 2019-12-08 DIAGNOSIS — R5383 Other fatigue: Secondary | ICD-10-CM

## 2019-12-08 LAB — CBC WITH DIFFERENTIAL (CANCER CENTER ONLY)
Abs Immature Granulocytes: 0.05 10*3/uL (ref 0.00–0.07)
Basophils Absolute: 0.1 10*3/uL (ref 0.0–0.1)
Basophils Relative: 1 %
Eosinophils Absolute: 0.5 10*3/uL (ref 0.0–0.5)
Eosinophils Relative: 6 %
HCT: 29.7 % — ABNORMAL LOW (ref 36.0–46.0)
Hemoglobin: 9.6 g/dL — ABNORMAL LOW (ref 12.0–15.0)
Immature Granulocytes: 1 %
Lymphocytes Relative: 24 %
Lymphs Abs: 2.4 10*3/uL (ref 0.7–4.0)
MCH: 32.1 pg (ref 26.0–34.0)
MCHC: 32.3 g/dL (ref 30.0–36.0)
MCV: 99.3 fL (ref 80.0–100.0)
Monocytes Absolute: 0.6 10*3/uL (ref 0.1–1.0)
Monocytes Relative: 6 %
Neutro Abs: 6.2 10*3/uL (ref 1.7–7.7)
Neutrophils Relative %: 62 %
Platelet Count: 347 10*3/uL (ref 150–400)
RBC: 2.99 MIL/uL — ABNORMAL LOW (ref 3.87–5.11)
RDW: 14.1 % (ref 11.5–15.5)
WBC Count: 9.8 10*3/uL (ref 4.0–10.5)
nRBC: 0 % (ref 0.0–0.2)

## 2019-12-08 LAB — LACTATE DEHYDROGENASE: LDH: 114 U/L (ref 98–192)

## 2019-12-08 LAB — CMP (CANCER CENTER ONLY)
ALT: 9 U/L (ref 0–44)
AST: 9 U/L — ABNORMAL LOW (ref 15–41)
Albumin: 3.6 g/dL (ref 3.5–5.0)
Alkaline Phosphatase: 69 U/L (ref 38–126)
Anion gap: 8 (ref 5–15)
BUN: 27 mg/dL — ABNORMAL HIGH (ref 6–20)
CO2: 14 mmol/L — ABNORMAL LOW (ref 22–32)
Calcium: 9.1 mg/dL (ref 8.9–10.3)
Chloride: 117 mmol/L — ABNORMAL HIGH (ref 98–111)
Creatinine: 2 mg/dL — ABNORMAL HIGH (ref 0.44–1.00)
GFR, Est AFR Am: 35 mL/min — ABNORMAL LOW (ref >60)
GFR, Estimated: 30 mL/min — ABNORMAL LOW (ref >60)
Glucose, Bld: 96 mg/dL (ref 70–99)
Potassium: 4.7 mmol/L (ref 3.5–5.1)
Sodium: 139 mmol/L (ref 135–145)
Total Bilirubin: <0.2 mg/dL — ABNORMAL LOW (ref 0.3–1.2)
Total Protein: 7.1 g/dL (ref 6.5–8.1)

## 2019-12-08 LAB — RETIC PANEL
Immature Retic Fract: 14.6 % (ref 2.3–15.9)
RBC.: 2.97 MIL/uL — ABNORMAL LOW (ref 3.87–5.11)
Retic Count, Absolute: 55.8 10*3/uL (ref 19.0–186.0)
Retic Ct Pct: 1.9 % (ref 0.4–3.1)
Reticulocyte Hemoglobin: 36.1 pg (ref >27.9)

## 2019-12-08 LAB — FOLATE: Folate: 17.9 ng/mL (ref >5.9)

## 2019-12-09 ENCOUNTER — Telehealth: Payer: Self-pay | Admitting: Hematology and Oncology

## 2019-12-09 LAB — KAPPA/LAMBDA LIGHT CHAINS
Kappa free light chain: 52.7 mg/L — ABNORMAL HIGH (ref 3.3–19.4)
Kappa, lambda light chain ratio: 1.13 (ref 0.26–1.65)
Lambda free light chains: 46.8 mg/L — ABNORMAL HIGH (ref 5.7–26.3)

## 2019-12-09 LAB — HAPTOGLOBIN: Haptoglobin: 203 mg/dL (ref 42–296)

## 2019-12-09 LAB — DIRECT ANTIGLOBULIN TEST (NOT AT ARMC)
DAT, IgG: NEGATIVE
DAT, complement: NEGATIVE

## 2019-12-09 NOTE — Telephone Encounter (Signed)
No appts needed per 7/12 los. Return if symptoms worsen or fail to improve.

## 2019-12-10 LAB — MULTIPLE MYELOMA PANEL, SERUM
Albumin SerPl Elph-Mcnc: 3.6 g/dL (ref 2.9–4.4)
Albumin/Glob SerPl: 1.3 (ref 0.7–1.7)
Alpha 1: 0.2 g/dL (ref 0.0–0.4)
Alpha2 Glob SerPl Elph-Mcnc: 0.8 g/dL (ref 0.4–1.0)
B-Globulin SerPl Elph-Mcnc: 0.9 g/dL (ref 0.7–1.3)
Gamma Glob SerPl Elph-Mcnc: 0.9 g/dL (ref 0.4–1.8)
Globulin, Total: 2.8 g/dL (ref 2.2–3.9)
IgA: 328 mg/dL (ref 87–352)
IgG (Immunoglobin G), Serum: 968 mg/dL (ref 586–1602)
IgM (Immunoglobulin M), Srm: 32 mg/dL (ref 26–217)
Total Protein ELP: 6.4 g/dL (ref 6.0–8.5)

## 2019-12-10 LAB — BETA 2 MICROGLOBULIN, SERUM: Beta-2 Microglobulin: 4.5 mg/L — ABNORMAL HIGH (ref 0.6–2.4)

## 2019-12-12 LAB — UPEP/UIFE/LIGHT CHAINS/TP, 24-HR UR
% BETA, Urine: 28.6 %
ALPHA 1 URINE: 10.5 %
Albumin, U: 11.6 %
Alpha 2, Urine: 20.6 %
Free Kappa Lt Chains,Ur: 179.34 mg/L — ABNORMAL HIGH (ref 0.63–113.79)
Free Kappa/Lambda Ratio: 7.4 (ref 1.03–31.76)
Free Lambda Lt Chains,Ur: 24.22 mg/L — ABNORMAL HIGH (ref 0.47–11.77)
GAMMA GLOBULIN URINE: 28.6 %
Total Protein, Urine-Ur/day: 122 mg/24 hr (ref 30–150)
Total Protein, Urine: 8.7 mg/dL
Total Volume: 1400

## 2019-12-20 ENCOUNTER — Encounter: Payer: Self-pay | Admitting: Hematology and Oncology

## 2019-12-22 ENCOUNTER — Telehealth: Payer: Self-pay | Admitting: Hematology and Oncology

## 2019-12-22 NOTE — Telephone Encounter (Signed)
Called Isabel [name] to discuss the results of her bloodwork from her prior visit.  Findings are consistent with elevated free light chains in the serum in the urine consistent with the patient's history of renal dysfunction.  These light chain elevations had normal ratios which is reassuring.  The patient did have a drop in hemoglobin, but also had a concurrent increase in creatinine up to 2.0.  Given these findings I think the patient's lab results are most consistent with kidney dysfunction.  I would recommend that she establish with nephrology.  The patient notes that she will be meeting with her primary care provider on 12/25/2019 and will discuss referral to nephrology at that time.  Overall there is no need for routine follow-up in our clinic, though we are happy to see the patient back in the event that there were to be other concerning abnormalities in her blood work or during her kidney evaluation.  Ulysees Barns, MD Department of Hematology/Oncology [facility] at [facility] Phone: [phone] Pager: [phone] Email: [email or web address]

## 2019-12-25 ENCOUNTER — Other Ambulatory Visit: Payer: Self-pay

## 2019-12-25 ENCOUNTER — Encounter: Payer: Self-pay | Admitting: Family Medicine

## 2019-12-25 ENCOUNTER — Ambulatory Visit: Payer: Self-pay | Attending: Family Medicine | Admitting: Family Medicine

## 2019-12-25 VITALS — BP 93/60 | HR 69 | Ht 67.0 in | Wt 194.0 lb

## 2019-12-25 DIAGNOSIS — D631 Anemia in chronic kidney disease: Secondary | ICD-10-CM

## 2019-12-25 DIAGNOSIS — I10 Essential (primary) hypertension: Secondary | ICD-10-CM

## 2019-12-25 DIAGNOSIS — I2510 Atherosclerotic heart disease of native coronary artery without angina pectoris: Secondary | ICD-10-CM

## 2019-12-25 DIAGNOSIS — N1832 Chronic kidney disease, stage 3b: Secondary | ICD-10-CM

## 2019-12-25 MED ORDER — AMLODIPINE BESYLATE 5 MG PO TABS: 5.0000 mg | ORAL_TABLET | Freq: Every day | ORAL | 1 refills | Status: DC

## 2019-12-25 MED ORDER — FERROUS SULFATE 324 (65 FE) MG PO TBEC: 1.0000 | DELAYED_RELEASE_TABLET | Freq: Two times a day (BID) | ORAL | 3 refills | Status: AC

## 2019-12-25 NOTE — Patient Instructions (Signed)
Food Basics for Chronic Kidney Disease When your kidneys are not working well, they cannot remove waste and excess substances from your blood as effectively as they did before. This can lead to a buildup and imbalance of these substances, which can worsen kidney damage and affect how your body functions. Certain foods lead to a buildup of these substances in the body. By changing your diet as recommended by your diet and nutrition specialist (dietitian) or health care provider, you could help prevent further kidney damage and delay or prevent the need for dialysis. What are tips for following this plan? General instructions   Work with your health care provider and dietitian to develop a meal plan that is right for you. Foods you can eat, limit, or avoid will be different for each person depending on the stage of kidney disease and any other existing health conditions.  Talk with your health care provider about whether you should take a vitamin and mineral supplement.  Use standard measuring cups and spoons to measure servings of foods. Use a kitchen scale to measure portions of protein foods.  If directed by your health care provider, avoid drinking too much fluid. Measure and count all liquids, including water, ice, soups, flavored gelatin, and frozen desserts such as popsicles or ice cream. Reading food labels  Check the amount of sodium in foods. Choose foods that have less than 300 milligrams (mg) per serving.  Check the ingredient list for phosphorus or potassium-based additives or preservatives.  Check the amount of saturated and trans fat. Limit or avoid these fats as told by your dietitian. Shopping  Avoid buying foods that are: ? Processed, frozen, or prepackaged. ? Calcium-enriched or fortified.  Do not buy foods that have salt or sodium listed among the first five ingredients.  Do not buy canned vegetables. Cooking  Replace animal proteins, such as meat, fish, eggs, or  dairy, with plant proteins from beans, nuts, and soy. ? Use soy milk instead of cow's milk. ? Add beans or tofu to soups, casseroles, or pasta dishes instead of meat.  Soak vegetables, such as potatoes, before cooking to reduce potassium. To do this: ? Peel and cut into small pieces. ? Soak in warm water for at least 2 hours. For every 1 cup of vegetables, use 10 cups of water. ? Drain and rinse with warm water. ? Boil for at least 5 minutes. Meal planning  Limit the amount of protein from plant and animal sources you eat each day.  Do not add salt to food when cooking or before eating.  Eat meals and snacks at around the same time each day. If you have diabetes:  If you have diabetes (diabetes mellitus) and chronic kidney disease, it is important to keep your blood glucose in the target range recommended by your health care provider. Follow your diabetes management plan. This may include: ? Checking your blood glucose regularly. ? Taking oral medicines, insulin, or both. ? Exercising for at least 30 minutes on 5 or more days each week, or as told by your health care provider. ? Tracking how many servings of carbohydrates you eat at each meal.  You may be given specific guidelines on how much of certain foods and nutrients you may eat, depending on your stage of kidney disease and whether you have high blood pressure (hypertension). Follow your meal plan as told by your dietitian. What nutrients should be limited? The items listed are not a complete list. Talk with your dietitian   about what dietary choices are best for you. Potassium Potassium affects how steadily your heart beats. If too much potassium builds up in your blood, it can cause an irregular heartbeat or even a heart attack. You may need to eat less potassium, depending on your blood potassium levels and the stage of kidney disease. Talk to your dietitian about how much potassium you may have each day. You may need to limit  or avoid foods that are high in potassium, such as:  Milk and soy milk.  Fruits, such as bananas, papaya, apricots, nectarines, melon, prunes, raisins, kiwi, and oranges.  Vegetables, such as potatoes, sweet potatoes, yams, tomatoes, leafy greens, beets, okra, avocado, pumpkin, and winter squash.  White and lima beans. Phosphorus Phosphorus is a mineral found in your bones. A balance between calcium and phosphorous is needed to build and maintain healthy bones. Too much phosphorus pulls calcium from your bones. This can make your bones weak and more likely to break. Too much phosphorus can also make your skin itch. You may need to eat less phosphorus depending on your blood phosphorus levels and the stage of kidney disease. Talk to your dietitian about how much potassium you may have each day. You may need to take medicine to lower your blood phosphorus levels if diet changes do not help. You may need to limit or avoid foods that are high in phosphorus, such as:  Milk and dairy products.  Dried beans and peas.  Tofu, soy milk, and other soy-based meat replacements.  Colas.  Nuts and peanut butter.  Meat, poultry, and fish.  Bran cereals and oatmeals. Protein Protein helps you to make and keep muscle. It also helps in the repair of your body's cells and tissues. One of the natural breakdown products of protein is a waste product called urea. When your kidneys are not working properly, they cannot remove wastes, such as urea, like they did before you developed chronic kidney disease. Reducing how much protein you eat can help prevent a buildup of urea in your blood. Depending on your stage of kidney disease, you may need to limit foods that are high in protein. Sources of animal protein include:  Meat (all types).  Fish and seafood.  Poultry.  Eggs.  Dairy. Other protein foods include:  Beans and legumes.  Nuts and nut butter.  Soy and tofu. Sodium Sodium, which is found  in salt, helps maintain a healthy balance of fluids in your body. Too much sodium can increase your blood pressure and have a negative effect on the function of your heart and lungs. Too much sodium can also cause your body to retain too much fluid, making your kidneys work harder. Most people should have less than 2,300 milligrams (mg) of sodium each day. If you have hypertension, you may need to limit your sodium to 1,500 mg each day. Talk to your dietitian about how much sodium you may have each day. You may need to limit or avoid foods that are high in sodium, such as:  Salt seasonings.  Soy sauce.  Cured and processed meats.  Salted crackers and snack foods.  Fast food.  Canned soups and most canned foods.  Pickled foods.  Vegetable juice.  Boxed mixes or ready-to-eat boxed meals and side dishes.  Bottled dressings, sauces, and marinades. Summary  Chronic kidney disease can lead to a buildup and imbalance of waste and excess substances in the body. Certain foods lead to a buildup of these substances. By adjusting   your intake of these foods, you could help prevent more kidney damage and delay or prevent the need for dialysis.  Food adjustments are different for each person with chronic kidney disease. Work with a dietitian to set up nutrient goals and a meal plan that is right for you.  If you have diabetes and chronic kidney disease, it is important to keep your blood glucose in the target range recommended by your health care provider. This information is not intended to replace advice given to you by your health care provider. Make sure you discuss any questions you have with your health care provider. Document Revised: 09/05/2018 Document Reviewed: 05/10/2016 Elsevier Patient Education  2020 [employer] Inc.  

## 2019-12-25 NOTE — Progress Notes (Signed)
States that she needs a referral for a kidney specialist.kidney

## 2019-12-25 NOTE — Progress Notes (Signed)
Subjective:  Patient ID: Isabel [name], female    DOB: [birth year]  Age: 44 y.o. MRN: [id number]  CC: Hypertension   HPI Isabel [name] is a 44 year old female with a history of NSTEMI status post DES in 11/2018, stage III chronic kidney disease, hypertension who presents today to establish care. She was hospitalized for acute on chronic renal failure in 09/2019 secondary to gastroenteritis and had presented with a creatinine of 6.84, discharge creatinine was 1.68. 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  Seen by oncology and notes reviewed - abnormalities in kappa/lambda light chains but stated not to be concerning for monoclonal gammopathy or plasma cell dyscrasia but likely due to renal dysfunction.  SPEP, SFLC, UPEP ordered.  Elevated kappa and lambda chains, CBC revealed anemia with hemoglobin of 9.6, LDH was normal at 114, creatinine was elevated at 2.0 with GFR of 30, beta-2 microglobulin elevated. Additional result notes by Oncology revealed findings consistent with renal dysfunction with a need for nephrology referral and no need for routine follow-up for the oncology clinic.  She complains of occasional fatigue and lightheadedness. Blood pressures have been in the 90s/60s even at home and she is currently on amlodipine, lisinopril and spironolactone. For her coronary artery disease she is on dual antiplatelet therapy with Brilinta and aspirin.  Past Medical History:  Diagnosis Date  . CAD (coronary artery disease)   . Hyperlipidemia LDL goal <70   . Hypertension   . NSTEMI (non-ST elevated myocardial infarction) (HCC) 12/19/2018  . Tobacco abuse     Past Surgical History:  Procedure Laterality Date  . CESAREAN SECTION    . CHOLECYSTECTOMY    . CORONARY STENT INTERVENTION N/A 12/20/2018   Procedure: CORONARY STENT INTERVENTION;  Surgeon: Runell Gess, MD;   [facility]: [facility];  Service: Cardiovascular;  Laterality: N/A;  . LEFT HEART CATH AND CORONARY ANGIOGRAPHY N/A 12/20/2018   Procedure: LEFT HEART CATH AND CORONARY ANGIOGRAPHY;  Surgeon: Runell Gess, MD;  Location: [facility];  Service: Cardiovascular;  Laterality: N/A;    Family History  Problem Relation Age of Onset  . Heart attack Mother 14  . Hypertension Mother   . Hypertension Father   . Heart attack Maternal Grandmother 55  . Diabetes Maternal Grandmother   . Hypertension Sister   . Hypertension Paternal Grandmother   . Breast cancer Paternal Grandmother   . Diabetes Paternal Grandmother   . Cervical cancer Paternal Grandmother   . Heart attack Paternal Grandfather 68  . Hypertension Paternal Grandfather     No Known Allergies  Outpatient Medications Prior to Visit  Medication Sig Dispense Refill  . acetaminophen (TYLENOL) 500 MG tablet Take 1,000 mg by mouth every 6 (six) hours as needed for headache (pain).     Marland Kitchen aspirin 81 MG chewable tablet Chew 1 tablet (81 mg total) by mouth daily.    . Evolocumab (REPATHA SURECLICK) 140 MG/ML SOAJ Inject 140 mg into the skin See admin instructions. Inject 140 mg subcutaneously on the 1st and 15th of each month    . ibuprofen (ADVIL) 200 MG tablet Take 600 mg by mouth every 6 (six) hours as needed (pain).    Marland Kitchen lisinopril (ZESTRIL) 40 MG tablet Take 20 mg by mouth 2 (two) times daily.     . nitroGLYCERIN (NITROSTAT) 0.4 MG SL tablet Place 1 tablet (0.4 mg total) under  the tongue every 5 (five) minutes x 3 doses as needed for chest pain. 25 tablet 3  . spironolactone (ALDACTONE) 25 MG tablet Take 1 tablet (25 mg total) by mouth daily. Dose decrease - please keep on file until next refill needed. 90 tablet 3  . ticagrelor (BRILINTA) 90 MG TABS tablet Take 1 tablet (90 mg total) by mouth 2 (two) times daily. 180 tablet 3  . amLODipine (NORVASC) 10 MG tablet Take 1 tablet (10 mg total) by mouth daily. 180 tablet 3  .  ondansetron (ZOFRAN ODT) 4 MG disintegrating tablet Take 1 tablet (4 mg total) by mouth every 8 (eight) hours as needed for nausea or vomiting. (Patient not taking: Reported on 12/08/2019) 20 tablet 0  . pantoprazole (PROTONIX) 40 MG tablet Take 1 tablet (40 mg total) by mouth daily. 30 tablet 0   No facility-administered medications prior to visit.     ROS Review of Systems  Constitutional: Positive for fatigue. Negative for activity change and appetite change.  HENT: Negative for congestion, sinus pressure and sore throat.   Eyes: Negative for visual disturbance.  Respiratory: Negative for cough, chest tightness, shortness of breath and wheezing.   Cardiovascular: Negative for chest pain and palpitations.  Gastrointestinal: Negative for abdominal distention, abdominal pain and constipation.  Endocrine: Negative for polydipsia.  Genitourinary: Negative for dysuria and frequency.  Musculoskeletal: Negative for arthralgias and back pain.  Skin: Negative for rash.  Neurological: Positive for light-headedness. Negative for tremors and numbness.  Hematological: Does not bruise/bleed easily.  Psychiatric/Behavioral: Negative for agitation and behavioral problems.    Objective:  BP (!) 93/60   Pulse 69   Ht 5\' 7"  (1.702 m)   Wt 194 lb (88 kg)   SpO2 100%   BMI 30.38 kg/m   BP/Weight 12/25/2019 12/08/2019 11/18/2019  Systolic BP 93 99 108  Diastolic BP 60 52 58  Wt. (Lbs) 194 195.3 190.12  BMI 30.38 30.59 29.78      Physical Exam Constitutional:      Appearance: She is well-developed.  Neck:     Vascular: No JVD.  Cardiovascular:     Rate and Rhythm: Normal rate.     Heart sounds: Normal heart sounds. No murmur heard.   Pulmonary:     Effort: Pulmonary effort is normal.     Breath sounds: Normal breath sounds. No wheezing or rales.  Chest:     Chest wall: No tenderness.  Abdominal:     General: Bowel sounds are normal. There is no distension.     Palpations: Abdomen is  soft. There is no mass.     Tenderness: There is no abdominal tenderness.  Musculoskeletal:        General: Normal range of motion.     Right lower leg: No edema.     Left lower leg: No edema.  Neurological:     Mental Status: She is alert and oriented to person, place, and time.  Psychiatric:        Mood and Affect: Mood normal.     CMP Latest Ref Rng & Units 12/08/2019 11/18/2019 11/13/2019  Glucose 70 - 99 mg/dL 96 99 95  BUN 6 - 20 mg/dL 11/15/2019) 21 24  Creatinine 0.44 - 1.00 mg/dL 75(Z) 0.25(E) 5.27(P)  Sodium 135 - 145 mmol/L 139 137 137  Potassium 3.5 - 5.1 mmol/L 4.7 4.9 5.7(H)  Chloride 98 - 111 mmol/L 117(H) 107(H) 106  CO2 22 - 32 mmol/L 14(L) 17(L) 17(L)  Calcium 8.9 -  10.3 mg/dL 9.1 9.5 9.6  Total Protein 6.5 - 8.1 g/dL 7.1 - 6.9  Total Bilirubin 0.3 - 1.2 mg/dL <6.8(T) - <1.5  Alkaline Phos 38 - 126 U/L 69 - 90  AST 15 - 41 U/L 9(L) - 12  ALT 0 - 44 U/L 9 - 11    Lipid Panel     Component Value Date/Time   CHOL 118 11/18/2019 0924   TRIG 251 (H) 11/18/2019 0924   HDL 35 (L) 11/18/2019 0924   CHOLHDL 3.4 11/18/2019 0924   CHOLHDL 4.3 12/19/2018 2354   VLDL 36 12/19/2018 2354   LDLCALC 44 11/18/2019 0924    CBC    Component Value Date/Time   WBC 9.8 12/08/2019 1408   WBC 7.4 10/29/2019 0532   RBC 2.99 (L) 12/08/2019 1408   RBC 2.97 (L) 12/08/2019 1408   HGB 9.6 (L) 12/08/2019 1408   HGB 11.7 11/13/2019 0957   HCT 29.7 (L) 12/08/2019 1408   HCT 36.5 11/13/2019 0957   PLT 347 12/08/2019 1408   PLT 338 11/13/2019 0957   MCV 99.3 12/08/2019 1408   MCV 98 (H) 11/13/2019 0957   MCH 32.1 12/08/2019 1408   MCHC 32.3 12/08/2019 1408   RDW 14.1 12/08/2019 1408   RDW 12.6 11/13/2019 0957   LYMPHSABS 2.4 12/08/2019 1408   LYMPHSABS 2.5 11/13/2019 0957   MONOABS 0.6 12/08/2019 1408   EOSABS 0.5 12/08/2019 1408   EOSABS 0.4 11/13/2019 0957   BASOSABS 0.1 12/08/2019 1408   BASOSABS 0.1 11/13/2019 0957    Lab Results  Component Value Date   HGBA1C 5.1  12/19/2018    Assessment & Plan:  1. Stage 3b chronic kidney disease Advised to avoid nephrotoxic agents - Ambulatory referral to Nephrology  2. Anemia due to stage 3b chronic kidney disease We will commence ferrous sulfate Advised on using laxative if she develops constipation - Ambulatory referral to Nephrology - ferrous sulfate 324 (65 Fe) MG TBEC; Take 1 tablet (325 mg total) by mouth in the morning and at bedtime.  Dispense: 60 tablet; Refill: 3  3. Essential hypertension Blood pressure is on the soft side Decreased amlodipine from 10 mg to 5 mg and advised to keep a blood pressure log at home Counseled on blood pressure goal of less than 130/80, low-sodium, DASH diet, medication compliance, 150 minutes of moderate intensity exercise per week. Discussed medication compliance, adverse effects. - amLODipine (NORVASC) 5 MG tablet; Take 1 tablet (5 mg total) by mouth daily.  Dispense: 90 tablet; Refill: 1  4. Coronary artery disease involving native coronary artery of native heart without angina pectoris Status post DES Continue dual antiplatelet therapy with Brilinta and aspirin for 1 year until end of this month and continue aspirin alone thereafter Risk factor modification   Meds ordered this encounter  Medications  . ferrous sulfate 324 (65 Fe) MG TBEC    Sig: Take 1 tablet (325 mg total) by mouth in the morning and at bedtime.    Dispense:  60 tablet    Refill:  3  . amLODipine (NORVASC) 5 MG tablet    Sig: Take 1 tablet (5 mg total) by mouth daily.    Dispense:  90 tablet    Refill:  1    Dose decrease    Follow-up: Return in about 1 month (around 01/25/2020) for complete physical exam.       Hoy Register, MD, FAAFP. [facility] [place], [place] [phone]   12/25/2019, 9:38  AM

## 2020-01-07 ENCOUNTER — Other Ambulatory Visit: Payer: Self-pay

## 2020-01-07 DIAGNOSIS — I10 Essential (primary) hypertension: Secondary | ICD-10-CM

## 2020-01-07 MED ORDER — AMLODIPINE BESYLATE 5 MG PO TABS: 5.0000 mg | ORAL_TABLET | Freq: Every day | ORAL | 3 refills | Status: DC

## 2020-01-22 ENCOUNTER — Other Ambulatory Visit: Payer: Self-pay

## 2020-01-22 DIAGNOSIS — I10 Essential (primary) hypertension: Secondary | ICD-10-CM

## 2020-01-22 MED ORDER — AMLODIPINE BESYLATE 5 MG PO TABS: 5.0000 mg | ORAL_TABLET | Freq: Every day | ORAL | 2 refills | Status: DC

## 2020-02-05 ENCOUNTER — Encounter: Payer: Self-pay | Admitting: Family Medicine

## 2020-04-08 ENCOUNTER — Other Ambulatory Visit: Payer: Self-pay | Admitting: Internal Medicine

## 2020-04-08 ENCOUNTER — Telehealth: Payer: Self-pay

## 2020-04-08 NOTE — Telephone Encounter (Signed)
Mailed snf repatha forms 

## 2020-05-06 ENCOUNTER — Other Ambulatory Visit: Payer: Self-pay

## 2020-05-06 ENCOUNTER — Encounter: Payer: Self-pay | Admitting: Internal Medicine

## 2020-05-06 ENCOUNTER — Ambulatory Visit (INDEPENDENT_AMBULATORY_CARE_PROVIDER_SITE_OTHER): Payer: Self-pay | Admitting: Internal Medicine

## 2020-05-06 VITALS — BP 110/70 | HR 81 | Ht 66.0 in | Wt 189.8 lb

## 2020-05-06 DIAGNOSIS — R002 Palpitations: Secondary | ICD-10-CM

## 2020-05-06 MED ORDER — METOPROLOL SUCCINATE ER 25 MG PO TB24
12.5000 mg | ORAL_TABLET | Freq: Every day | ORAL | 3 refills | Status: DC
Start: 2020-05-06 — End: 2021-03-31

## 2020-05-06 MED ORDER — AMLODIPINE BESYLATE 2.5 MG PO TABS
2.5000 mg | ORAL_TABLET | Freq: Every day | ORAL | 3 refills | Status: DC
Start: 2020-05-06 — End: 2021-03-30

## 2020-05-06 NOTE — Progress Notes (Signed)
Cardiology Office Note   Date:  05/06/2020   ID:  Isabel [name], DOB 03-24-1976, MRN [id number]  PCP:  Hoy Register, MD  Cardiologist:   Dietrich Pates, MD   Pt presents for f/u of CAD    History of Present Illness: Isabel [name] is a 45 y.o. female with a history of HTN, HL, hypokalemia and CAD   She is s/p NSTEMI in 12/20/18:   80% prox RCA   She is s/p DES stent   Echo showed LVEF 60 to 65%  The pt was admitted into the hosp for a GI illness.  Creatinine on admission was 6.84 bicarb 11.  On admission her hemoglobin was 10.9 went down to 7 she was transfused.  Work-up for anemia and kidney disease was in progress.  This included a Bence-Jones measurement which was positive.  She is being set up to be seen in hematology I saw the pt in clinic in June 2021    Since then she says over the past week she has had problem with food and keeping it down    Diarrhea   When not eating 3   If eat then 6 or 7  Has taken lomotil and Fe  No blood in stool When urinating it is dark   Has had some palpitaitons  Dizzy with them at times   Rapid HR lasting 5 to 10 min  Go away then   BP at home about 110   /   P resting can be 100 to 110   Current Meds  Medication Sig  . acetaminophen (TYLENOL) 500 MG tablet Take 1,000 mg by mouth every 6 (six) hours as needed for headache (pain).   Marland Kitchen amLODipine (NORVASC) 5 MG tablet Take 1 tablet (5 mg total) by mouth daily.  Marland Kitchen aspirin 81 MG chewable tablet Chew 1 tablet (81 mg total) by mouth daily.  . Evolocumab (REPATHA SURECLICK) 140 MG/ML SOAJ Inject 140 mg into the skin See admin instructions. Inject 140 mg subcutaneously on the 1st and 15th of each month  . ferrous sulfate 324 (65 Fe) MG TBEC Take 1 tablet (325 mg total) by mouth in the morning and at bedtime.  Marland Kitchen ibuprofen (ADVIL) 200 MG tablet Take 600 mg by mouth every 6 (six) hours as needed (pain).  Marland Kitchen lisinopril (ZESTRIL) 40 MG tablet Take 0.5 tablets (20 mg total) by mouth 2 (two) times daily.  .  nitroGLYCERIN (NITROSTAT) 0.4 MG SL tablet Place 1 tablet (0.4 mg total) under the tongue every 5 (five) minutes x 3 doses as needed for chest pain.  Marland Kitchen ondansetron (ZOFRAN ODT) 4 MG disintegrating tablet Take 1 tablet (4 mg total) by mouth every 8 (eight) hours as needed for nausea or vomiting.  Marland Kitchen spironolactone (ALDACTONE) 25 MG tablet Take 1 tablet (25 mg total) by mouth daily. Dose decrease - please keep on file until next refill needed.     Allergies:   Patient has no known allergies.   Past Medical History:  Diagnosis Date  . CAD (coronary artery disease)   . Hyperlipidemia LDL goal <70   . Hypertension   . NSTEMI (non-ST elevated myocardial infarction) (HCC) 12/19/2018  . Tobacco abuse     Past Surgical History:  Procedure Laterality Date  . CESAREAN SECTION    . CHOLECYSTECTOMY    . CORONARY STENT INTERVENTION N/A 12/20/2018   Procedure: CORONARY STENT INTERVENTION;  Surgeon: Runell Gess, MD;  Location: [facility];  Service:  Cardiovascular;  Laterality: N/A;  . LEFT HEART CATH AND CORONARY ANGIOGRAPHY N/A 12/20/2018   Procedure: LEFT HEART CATH AND CORONARY ANGIOGRAPHY;  Surgeon: Runell Gess, MD;  Location: [facility];  Service: Cardiovascular;  Laterality: N/A;     Social History:  The patient  reports that she has quit smoking. Her smoking use included cigarettes. She has a 2.50 pack-year smoking history. She has never used smokeless tobacco. She reports that she does not drink alcohol and does not use drugs.   Family History:  The patient's family history includes Breast cancer in her paternal grandmother; Cervical cancer in her paternal grandmother; Diabetes in her maternal grandmother and paternal grandmother; Heart attack (age of onset: 66) in her maternal grandmother; Heart attack (age of onset: 36) in her mother; Heart attack (age of onset: 74) in her paternal grandfather; Hypertension in her father, mother, paternal grandfather, paternal  grandmother, and sister.    ROS:  Please see the history of present illness. All other systems are reviewed and  Negative to the above problem except as noted.    PHYSICAL EXAM: VS:  BP 110/70   Pulse 81   Ht 5\' 6"  (1.676 m)   Wt 189 lb 12.8 oz (86.1 kg)   SpO2 100%   BMI 30.63 kg/m   GEN: Well nourished, well developed, in no acute distress  HEENT: normal  Neck: no JVD, carotid bruits Cardiac: RRR; no murmurs,no LE edema  Respiratory:  clear to auscultation bilaterally, normal work of breathing GI: soft, nontender, nondistended, + BS  No hepatomegaly  MS: no deformity Moving all extremities   Skin: warm and dry, no rash Neuro:  Strength and sensation are intact Psych: euthymic mood, full affect   EKG:  EKG is ordered today.  SR 81     Lipid Panel    Component Value Date/Time   CHOL 118 11/18/2019 0924   TRIG 251 (H) 11/18/2019 0924   HDL 35 (L) 11/18/2019 0924   CHOLHDL 3.4 11/18/2019 0924   CHOLHDL 4.3 12/19/2018 2354   VLDL 36 12/19/2018 2354   LDLCALC 44 11/18/2019 0924      Wt Readings from Last 3 Encounters:  05/06/20 189 lb 12.8 oz (86.1 kg)  12/25/19 194 lb (88 kg)  12/08/19 195 lb 4.8 oz (88.6 kg)      ASSESSMENT AND PLAN:  1.  CAD.  Doing well post stent in July 2020. She is back on ASA alone  Doing OK from cardiac standpoint I am not convinced of any angina  2  Palpitations   Most likely related to diarrhea and relative volume depletion.  I would pull back on amlodipine to 2.5 mg   Add 1/2 of Toprol XL 25 mg   Follow BP  3  2.  Dyslipidemia. Keep on Repatha   Last LDL 44  HDL 35  4  GI   Pt with loose BMs    If persists for over a week needs to check in with IM     5  CKD   Will check CMET today   Stay hydrated     6  Heme  Check CBC today  Follow-up in April     Current medicines are reviewed at length with the patient today.  The patient does not have concerns regarding medicines.  Signed, May, MD  05/06/2020 8:18 AM    [facility] [address] [place], [place], [place]  [place] Phone: [phone];  Fax: [phone]

## 2020-05-06 NOTE — Patient Instructions (Addendum)
Medication Instructions:  Your physician has recommended you make the following change in your medication:  1.) decrease amlodipine to 2.5 mg daily 2.) start metoprolol succinate (Toprol XL) 25 mg - take HALF tablet by mouth daily (will increase to whole tab if symptoms do not improve)  *If you need a refill on your cardiac medications before your next appointment, please call your pharmacy*   Lab Work: Today: cbc, cmet  If you have labs (blood work) drawn today and your tests are completely normal, you will receive your results only by: Marland Kitchen MyChart Message (if you have MyChart) OR . A paper copy in the mail If you have any lab test that is abnormal or we need to change your treatment, we will call you to review the results.   Testing/Procedures: none   Follow-Up: At [facility], you and your health needs are our priority.  As part of our continuing mission to provide you with exceptional heart care, we have created designated Provider Care Teams.  These Care Teams include your primary Cardiologist (physician) and Advanced Practice Providers (APPs -  Physician Assistants and Nurse Practitioners) who all work together to provide you with the care you need, when you need it.   Your next appointment:   4 month(s)  The format for your next appointment:   In Person  Provider:   You may see Dietrich Pates, MD or one of the following Advanced Practice Providers on your designated Care Team:    Tereso Newcomer, PA-C  Chelsea Aus, [place]    Other Instructions

## 2020-05-07 LAB — CBC
Hematocrit: 31.3 % — ABNORMAL LOW (ref 34.0–46.6)
Hemoglobin: 10.5 g/dL — ABNORMAL LOW (ref 11.1–15.9)
MCH: 31.8 pg (ref 26.6–33.0)
MCHC: 33.5 g/dL (ref 31.5–35.7)
MCV: 95 fL (ref 79–97)
Platelets: 320 10*3/uL (ref 150–450)
RBC: 3.3 x10E6/uL — ABNORMAL LOW (ref 3.77–5.28)
RDW: 12.9 % (ref 11.7–15.4)
WBC: 10.9 10*3/uL — ABNORMAL HIGH (ref 3.4–10.8)

## 2020-05-07 LAB — COMPREHENSIVE METABOLIC PANEL
ALT: 6 IU/L (ref 0–32)
AST: 6 IU/L (ref 0–40)
Albumin/Globulin Ratio: 1.4 (ref 1.2–2.2)
Albumin: 4 g/dL (ref 3.8–4.8)
Alkaline Phosphatase: 69 IU/L (ref 44–121)
BUN/Creatinine Ratio: 19 (ref 9–23)
BUN: 37 mg/dL — ABNORMAL HIGH (ref 6–24)
Bilirubin Total: <0.2 mg/dL (ref 0.0–1.2)
CO2: 13 mmol/L — ABNORMAL LOW (ref 20–29)
Calcium: 9.8 mg/dL (ref 8.7–10.2)
Chloride: 110 mmol/L — ABNORMAL HIGH (ref 96–106)
Creatinine, Ser: 2 mg/dL — ABNORMAL HIGH (ref 0.57–1.00)
GFR calc Af Amer: 34 mL/min/{1.73_m2} — ABNORMAL LOW (ref >59)
GFR calc non Af Amer: 30 mL/min/{1.73_m2} — ABNORMAL LOW (ref >59)
Globulin, Total: 2.9 g/dL (ref 1.5–4.5)
Glucose: 102 mg/dL — ABNORMAL HIGH (ref 65–99)
Potassium: 4.2 mmol/L (ref 3.5–5.2)
Sodium: 140 mmol/L (ref 134–144)
Total Protein: 6.9 g/dL (ref 6.0–8.5)

## 2020-12-31 ENCOUNTER — Other Ambulatory Visit: Payer: Self-pay | Admitting: Internal Medicine

## 2020-12-31 ENCOUNTER — Other Ambulatory Visit: Payer: Self-pay | Admitting: *Deleted

## 2020-12-31 MED ORDER — LISINOPRIL 40 MG PO TABS: 20.0000 mg | ORAL_TABLET | Freq: Two times a day (BID) | ORAL | 0 refills | Status: DC

## 2021-03-04 ENCOUNTER — Other Ambulatory Visit: Payer: Self-pay | Admitting: Internal Medicine

## 2021-03-04 DIAGNOSIS — I10 Essential (primary) hypertension: Secondary | ICD-10-CM

## 2021-03-29 DIAGNOSIS — N183 Chronic kidney disease, stage 3 unspecified: Secondary | ICD-10-CM | POA: Insufficient documentation

## 2021-03-29 NOTE — Progress Notes (Signed)
Cardiology Office Note:    Date:  03/30/2021   ID:  Isabel [name], DOB 10-18-1975, MRN [id number]  PCP:  Charlott Rakes, MD   [facility] Providers Cardiologist:  Dorris Carnes, MD     Referring MD: Charlott Rakes, MD   Chief Complaint:  F/u for CAD    Patient Profile:   Isabel [name] is a 45 y.o. female with:  Coronary artery disease  S/p NSTEMI in 11/2018 s/p DES to RCA  Chronic kidney disease  Admx in 5/21 w ARF and worsening anemia in the setting of gastroenteritis (req'd txn w PRBCs) Anemia  Hypertension  Hyperlipidemia  Hypokalemia   History of Present Illness: [name] was last seen by Dr. Harrington Challenger in 12/21.  She returns for f/u.  She is here alone.  She has not had chest pain.  She has noted shortness of breath with some activities over the past several weeks.  She did have COVID-19 in January.  She never really had any shortness of breath with this.  However, she has had significant issues with dysgeusia that has not improved.  She has not had orthopnea, leg edema, syncope.  She does get lightheaded sometimes when she stands up quickly.  She notes that she does not hydrate well.  ASSESSMENT & PLAN:   Shortness of breath Etiology not entirely clear.  She notes that her diet is poor in the setting of dysgeusia from COVID-19.  She has had significant issues with anemia in the past.  She is still taking iron.  She does not really have any signs of volume excess on exam.  She has not had chest discomfort or symptoms reminiscent of her previous angina.  Her electrocardiogram is normal.  -Obtain 2D echocardiogram  -Labs: C-Met, CBC, TSH, NT proBNP  -Follow-up 3 months or sooner if symptoms worsen  CAD (coronary artery disease) History of non-STEMI in 2022 with a DES to the RCA.  She had no significant disease elsewhere.  EF was normal at that time.  Given her shortness of breath, I will obtain an echocardiogram.  Given her chronic kidney disease, we would likely try to  avoid cardiac catheterization unless absolutely necessary.  At this point, I do not think she needs stress testing.  Continue aspirin 81 mg daily, metoprolol succinate 12.5 mg daily.  She has a history of myalgias with high-dose statins.  Essential hypertension Blood pressure well controlled on amlodipine 2.5 mg daily, lisinopril 20 mg twice daily, metoprolol succinate 12.5 mg daily, spironolactone 25 mg daily.  Obtain follow-up CMET today.  CKD (chronic kidney disease) stage 3, GFR 30-59 ml/min (HCC) She notes that she cannot get into see nephrology due to no insurance.  I have asked her to follow-up with primary care.  Obtain CMET today.  Hyperlipidemia She felt that her cholesterol numbers were increasing on evolocumab.  She stopped this several months ago.  I will try her on low-dose rosuvastatin 10 mg Monday, Wednesday, Friday.  Obtain fasting lipids today.  Repeat fasting lipids and LFTs in 3 months.  Normocytic anemia Question if worsening anemia is contributing to her shortness of breath.  Obtain CBC today.            Dispo:  Return in about 3 months (around 06/30/2021) for Routine follow up in 3 months with Dr.Ross. .    Prior CV studies: Echocardiogram 12/20/2018 EF 60-65, mod conc LVH, Gr 1 DD, normal RVSF, mod LAE  Cardiac catheterization 12/20/2018 RCA prox 80 EF 55-65  PCI: 2.5 x 16 mm Synergy DES to RCA       Past Medical History:  Diagnosis Date   CAD (coronary artery disease)    Hyperlipidemia LDL goal <70    Hypertension    NSTEMI (non-ST elevated myocardial infarction) ([place]) 12/19/2018   Tobacco abuse    Current Medications: Current Meds  Medication Sig   acetaminophen (TYLENOL) 500 MG tablet Take 1,000 mg by mouth every 6 (six) hours as needed for headache (pain).    aspirin 81 MG chewable tablet Chew 1 tablet (81 mg total) by mouth daily.   ferrous sulfate 324 (65 Fe) MG TBEC Take 1 tablet (325 mg total) by mouth in the morning and at bedtime.   ibuprofen  (ADVIL) 200 MG tablet Take 600 mg by mouth every 6 (six) hours as needed (pain).   lisinopril (ZESTRIL) 40 MG tablet Take 0.5 tablets (20 mg total) by mouth 2 (two) times daily.   metoprolol succinate (TOPROL XL) 25 MG 24 hr tablet Take 0.5 tablets (12.5 mg total) by mouth daily.   nitroGLYCERIN (NITROSTAT) 0.4 MG SL tablet Place 1 tablet (0.4 mg total) under the tongue every 5 (five) minutes x 3 doses as needed for chest pain.   ondansetron (ZOFRAN ODT) 4 MG disintegrating tablet Take 1 tablet (4 mg total) by mouth every 8 (eight) hours as needed for nausea or vomiting.   pantoprazole (PROTONIX) 40 MG tablet Take 1 tablet (40 mg total) by mouth daily.   rosuvastatin (CRESTOR) 10 MG tablet Take 1 tablet (10 mg total) by mouth 3 (three) times a week.   spironolactone (ALDACTONE) 25 MG tablet TAKE 1 TABLET(25 MG) BY MOUTH DAILY   [DISCONTINUED] amLODipine (NORVASC) 2.5 MG tablet Take 1 tablet (2.5 mg total) by mouth daily.    Allergies:   Patient has no known allergies.   Social History   Tobacco Use   Smoking status: Former    Packs/day: 0.25    Years: 10.00    Pack years: 2.50    Types: Cigarettes   Smokeless tobacco: Never   Tobacco comments:    down to 1 cigarrettes per day  Vaping Use   Vaping Use: Never used  Substance Use Topics   Alcohol use: Never   Drug use: Never    Family Hx: The patient's family history includes Breast cancer in her paternal grandmother; Cervical cancer in her paternal grandmother; Diabetes in her maternal grandmother and paternal grandmother; Heart attack (age of onset: 84) in her maternal grandmother; Heart attack (age of onset: 1) in her mother; Heart attack (age of onset: 71) in her paternal grandfather; Hypertension in her father, mother, paternal grandfather, paternal grandmother, and sister.  Review of Systems  Constitutional: Negative for fever.  Respiratory:  Negative for cough.   Endocrine: Positive for cold intolerance.  Gastrointestinal:   Negative for hematochezia and melena.  Genitourinary:  Negative for hematuria.    EKGs/Labs/Other Test Reviewed:    EKG:  EKG is  ordered today.  The ekg ordered today demonstrates NSR, HR 65, normal axis, no ST-T wave changes, QTC 407  Recent Labs: 05/06/2020: ALT 6; BUN 37; Creatinine, Ser 2.00; Hemoglobin 10.5; Platelets 320; Potassium 4.2; Sodium 140   Recent Lipid Panel Lab Results  Component Value Date/Time   CHOL 118 11/18/2019 09:24 AM   TRIG 251 (H) 11/18/2019 09:24 AM   HDL 35 (L) 11/18/2019 09:24 AM   LDLCALC 44 11/18/2019 09:24 AM    Risk Assessment/Calculations:  Physical Exam:    VS:  BP 110/64   Pulse 64   Ht _0  (1.676 m)   Wt 170 lb 12.8 oz (77.5 kg)   SpO2 97%   BMI 27.57 kg/m     Wt Readings from Last 3 Encounters:  03/30/21 170 lb 12.8 oz (77.5 kg)  05/06/20 189 lb 12.8 oz (86.1 kg)  12/25/19 194 lb (88 kg)    Constitutional:      Appearance: Healthy appearance. Not in distress.  Neck:     Thyroid: No thyromegaly.     Vascular: No JVR. JVD normal.  Pulmonary:     Effort: Pulmonary effort is normal.     Breath sounds: No wheezing. No rales.  Cardiovascular:     Normal rate. Regular rhythm. Normal S1. Normal S2.      Murmurs: There is no murmur.  Edema:    Peripheral edema absent.  Abdominal:     Palpations: Abdomen is soft. There is no hepatomegaly.  Skin:    General: Skin is warm and dry.  Neurological:     Mental Status: Alert and oriented to person, place and time.     Cranial Nerves: Cranial nerves are intact.       Medication Adjustments/Labs and Tests Ordered: Current medicines are reviewed at length with the patient today.  Concerns regarding medicines are outlined above.  Tests Ordered: Orders Placed This Encounter  Procedures   Comp Met (CMET)   Lipid Profile   Pro b natriuretic peptide   CBC   TSH   Lipid Profile   Hepatic function panel   EKG 12-Lead   ECHOCARDIOGRAM COMPLETE    Medication  Changes: Meds ordered this encounter  Medications   rosuvastatin (CRESTOR) 10 MG tablet    Sig: Take 1 tablet (10 mg total) by mouth 3 (three) times a week.    Dispense:  36 tablet    Refill:  3   amLODipine (NORVASC) 2.5 MG tablet    Sig: Take 1 tablet (2.5 mg total) by mouth daily.    Dispense:  90 tablet    Refill:  3    Dose decrease    Signed, Richardson Dopp, PA-C  03/30/2021 9:25 AM    [facility] [address], [place], [place]  [address] Phone: [phone]; Fax: [phone]

## 2021-03-30 ENCOUNTER — Encounter: Payer: Self-pay | Admitting: Physician Assistant

## 2021-03-30 ENCOUNTER — Other Ambulatory Visit: Payer: Self-pay

## 2021-03-30 ENCOUNTER — Ambulatory Visit (INDEPENDENT_AMBULATORY_CARE_PROVIDER_SITE_OTHER): Payer: Self-pay | Admitting: Physician Assistant

## 2021-03-30 VITALS — BP 110/64 | HR 64 | Ht 66.0 in | Wt 170.8 lb

## 2021-03-30 DIAGNOSIS — I10 Essential (primary) hypertension: Secondary | ICD-10-CM

## 2021-03-30 DIAGNOSIS — I2510 Atherosclerotic heart disease of native coronary artery without angina pectoris: Secondary | ICD-10-CM

## 2021-03-30 DIAGNOSIS — E782 Mixed hyperlipidemia: Secondary | ICD-10-CM

## 2021-03-30 DIAGNOSIS — D649 Anemia, unspecified: Secondary | ICD-10-CM

## 2021-03-30 DIAGNOSIS — R0602 Shortness of breath: Secondary | ICD-10-CM | POA: Insufficient documentation

## 2021-03-30 DIAGNOSIS — N1832 Chronic kidney disease, stage 3b: Secondary | ICD-10-CM

## 2021-03-30 MED ORDER — ROSUVASTATIN CALCIUM 10 MG PO TABS: 10.0000 mg | ORAL_TABLET | ORAL | 3 refills | Status: DC

## 2021-03-30 MED ORDER — AMLODIPINE BESYLATE 2.5 MG PO TABS: 2.5000 mg | ORAL_TABLET | Freq: Every day | ORAL | 3 refills | Status: DC

## 2021-03-30 NOTE — Assessment & Plan Note (Signed)
History of non-STEMI in 2022 with a DES to the RCA.  She had no significant disease elsewhere.  EF was normal at that time.  Given her shortness of breath, I will obtain an echocardiogram.  Given her chronic kidney disease, we would likely try to avoid cardiac catheterization unless absolutely necessary.  At this point, I do not think she needs stress testing.  Continue aspirin 81 mg daily, metoprolol succinate 12.5 mg daily.  She has a history of myalgias with high-dose statins.

## 2021-03-30 NOTE — Assessment & Plan Note (Signed)
She notes that she cannot get into see nephrology due to no insurance.  I have asked her to follow-up with primary care.  Obtain CMET today.

## 2021-03-30 NOTE — Assessment & Plan Note (Signed)
Blood pressure well controlled on amlodipine 2.5 mg daily, lisinopril 20 mg twice daily, metoprolol succinate 12.5 mg daily, spironolactone 25 mg daily.  Obtain follow-up CMET today.

## 2021-03-30 NOTE — Assessment & Plan Note (Signed)
Question if worsening anemia is contributing to her shortness of breath.  Obtain CBC today.

## 2021-03-30 NOTE — Patient Instructions (Signed)
Medication Instructions:   START Rosuvastatin one tablet by mouth ( 10 mg) Monday, Wednesday, and Friday Only.  *If you need a refill on your cardiac medications before your next appointment, please call your pharmacy*   Lab Work: TODAY!!!!! CMET/LIPID/PRO BNP/CBC/TSH  Your physician recommends that you return for a FASTING lipid profile/lft on Wednesday, January 25 You can come in the day of appointment between 7:30-4:30 fasting from midnight the night before.    If you have labs (blood work) drawn today and your tests are completely normal, you will receive your results only by: MyChart Message (if you have MyChart) OR A paper copy in the mail If you have any lab test that is abnormal or we need to change your treatment, we will call you to review the results.   Testing/Procedures:  Your physician has requested that you have an echocardiogram. Echocardiography is a painless test that uses sound waves to create images of your heart. It provides your doctor with information about the size and shape of your heart and how well your heart's chambers and valves are working. This procedure takes approximately one hour. There are no restrictions for this procedure.   Follow-Up: At [facility], you and your health needs are our priority.  As part of our continuing mission to provide you with exceptional heart care, we have created designated Provider Care Teams.  These Care Teams include your primary Cardiologist (physician) and Advanced Practice Providers (APPs -  Physician Assistants and Nurse Practitioners) who all work together to provide you with the care you need, when you need it.  We recommend signing up for the patient portal called "MyChart".  Sign up information is provided on this After Visit Summary.  MyChart is used to connect with patients for Virtual Visits (Telemedicine).  Patients are able to view lab/test results, encounter notes, upcoming appointments, etc.  Non-urgent  messages can be sent to your provider as well.   To learn more about what you can do with MyChart, go to [email or web address].    Your next appointment:   3 month(s)  The format for your next appointment:   In Person  Provider:   Dietrich Pates, MD   Other Instructions

## 2021-03-30 NOTE — Assessment & Plan Note (Signed)
Etiology not entirely clear.  She notes that her diet is poor in the setting of dysgeusia from COVID-19.  She has had significant issues with anemia in the past.  She is still taking iron.  She does not really have any signs of volume excess on exam.  She has not had chest discomfort or symptoms reminiscent of her previous angina.  Her electrocardiogram is normal.  -Obtain 2D echocardiogram  -Labs: C-Met, CBC, TSH, NT proBNP  -Follow-up 3 months or sooner if symptoms worsen

## 2021-03-30 NOTE — Assessment & Plan Note (Signed)
She felt that her cholesterol numbers were increasing on evolocumab.  She stopped this several months ago.  I will try her on low-dose rosuvastatin 10 mg Monday, Wednesday, Friday.  Obtain fasting lipids today.  Repeat fasting lipids and LFTs in 3 months.

## 2021-03-31 ENCOUNTER — Other Ambulatory Visit: Payer: Self-pay | Admitting: Internal Medicine

## 2021-03-31 LAB — COMPREHENSIVE METABOLIC PANEL WITH GFR
ALT: 33 IU/L — ABNORMAL HIGH (ref 0–32)
AST: 23 IU/L (ref 0–40)
Albumin/Globulin Ratio: 2 (ref 1.2–2.2)
Albumin: 4.3 g/dL (ref 3.8–4.8)
Alkaline Phosphatase: 66 IU/L (ref 44–121)
BUN/Creatinine Ratio: 12 (ref 9–23)
BUN: 25 mg/dL — ABNORMAL HIGH (ref 6–24)
Bilirubin Total: <0.2 mg/dL (ref 0.0–1.2)
CO2: 15 mmol/L — ABNORMAL LOW (ref 20–29)
Calcium: 9.6 mg/dL (ref 8.7–10.2)
Chloride: 110 mmol/L — ABNORMAL HIGH (ref 96–106)
Creatinine, Ser: 2.03 mg/dL — ABNORMAL HIGH (ref 0.57–1.00)
Globulin, Total: 2.2 g/dL (ref 1.5–4.5)
Glucose: 81 mg/dL (ref 70–99)
Potassium: 4.9 mmol/L (ref 3.5–5.2)
Sodium: 138 mmol/L (ref 134–144)
Total Protein: 6.5 g/dL (ref 6.0–8.5)
eGFR: 30 mL/min/1.73 — ABNORMAL LOW

## 2021-03-31 LAB — CBC
Hematocrit: 33.1 % — ABNORMAL LOW (ref 34.0–46.6)
Hemoglobin: 11 g/dL — ABNORMAL LOW (ref 11.1–15.9)
MCH: 30.7 pg (ref 26.6–33.0)
MCHC: 33.2 g/dL (ref 31.5–35.7)
MCV: 93 fL (ref 79–97)
Platelets: 325 10*3/uL (ref 150–450)
RBC: 3.58 x10E6/uL — ABNORMAL LOW (ref 3.77–5.28)
RDW: 12.1 % (ref 11.7–15.4)
WBC: 7.9 10*3/uL (ref 3.4–10.8)

## 2021-03-31 LAB — TSH: TSH: 0.798 u[IU]/mL (ref 0.450–4.500)

## 2021-03-31 LAB — LIPID PANEL
Chol/HDL Ratio: 6 ratio — ABNORMAL HIGH (ref 0.0–4.4)
Cholesterol, Total: 181 mg/dL (ref 100–199)
HDL: 30 mg/dL — ABNORMAL LOW
LDL Chol Calc (NIH): 118 mg/dL — ABNORMAL HIGH (ref 0–99)
Triglycerides: 186 mg/dL — ABNORMAL HIGH (ref 0–149)
VLDL Cholesterol Cal: 33 mg/dL (ref 5–40)

## 2021-03-31 LAB — PRO B NATRIURETIC PEPTIDE: NT-Pro BNP: 64 pg/mL (ref 0–130)

## 2021-04-01 ENCOUNTER — Telehealth: Payer: Self-pay | Admitting: Licensed Clinical Social Worker

## 2021-04-01 NOTE — Telephone Encounter (Signed)
CSW consulted to reach out to pt to discuss getting insurance coverage of some kind so she can see nephrologist regularly.  CSW attempted to call pt to discuss- left message requesting return call  Burna Sis, LCSW Clinical Social Worker [facility] Desk#: [phone] Cell#: [phone]

## 2021-04-13 ENCOUNTER — Telehealth (HOSPITAL_COMMUNITY): Payer: Self-pay | Admitting: Licensed Clinical Social Worker

## 2021-04-13 NOTE — Telephone Encounter (Signed)
CSW attempted to call pt to discuss pt getting coverage to help with regular nephrology visits- unable to reach -left VM requesting return call  Burna Sis, LCSW Clinical Social Worker [facility] Desk#: [phone] Cell#: [phone]

## 2021-04-18 ENCOUNTER — Other Ambulatory Visit: Payer: Self-pay

## 2021-04-18 ENCOUNTER — Ambulatory Visit (HOSPITAL_COMMUNITY): Payer: Self-pay | Attending: Cardiovascular Disease

## 2021-04-18 DIAGNOSIS — D649 Anemia, unspecified: Secondary | ICD-10-CM | POA: Insufficient documentation

## 2021-04-18 DIAGNOSIS — I2510 Atherosclerotic heart disease of native coronary artery without angina pectoris: Secondary | ICD-10-CM | POA: Insufficient documentation

## 2021-04-18 DIAGNOSIS — I10 Essential (primary) hypertension: Secondary | ICD-10-CM | POA: Insufficient documentation

## 2021-04-18 DIAGNOSIS — N1832 Chronic kidney disease, stage 3b: Secondary | ICD-10-CM | POA: Insufficient documentation

## 2021-04-18 DIAGNOSIS — R0602 Shortness of breath: Secondary | ICD-10-CM | POA: Insufficient documentation

## 2021-04-18 DIAGNOSIS — E782 Mixed hyperlipidemia: Secondary | ICD-10-CM | POA: Insufficient documentation

## 2021-04-18 LAB — ECHOCARDIOGRAM COMPLETE
Area-P 1/2: 3.39 cm2
S' Lateral: 2.8 cm

## 2021-04-19 ENCOUNTER — Encounter: Payer: Self-pay | Admitting: Physician Assistant

## 2021-04-29 ENCOUNTER — Other Ambulatory Visit: Payer: Self-pay | Admitting: *Deleted

## 2021-04-29 MED ORDER — ATORVASTATIN CALCIUM 20 MG PO TABS: 20.0000 mg | ORAL_TABLET | ORAL | 11 refills | Status: DC

## 2021-04-29 NOTE — Telephone Encounter (Signed)
Atorvastatin is $15 at [facility] for 30 day supply and $ at [facility] with Good [facility].  PLAN:  DC Rosuvastatin Start Atorvastatin 20 mg every Mon, Wed, Fri Repeat fasting Lipids and LFTs 06/22/2021 as planned. Tereso Newcomer, PA-C    04/29/2021 9:46 AM

## 2021-06-22 ENCOUNTER — Other Ambulatory Visit: Payer: Medicaid Other

## 2021-06-28 NOTE — Progress Notes (Deleted)
No show

## 2021-06-29 ENCOUNTER — Ambulatory Visit: Payer: Self-pay | Admitting: Internal Medicine

## 2021-07-08 ENCOUNTER — Other Ambulatory Visit: Payer: Self-pay | Admitting: *Deleted

## 2021-07-08 MED ORDER — LISINOPRIL 40 MG PO TABS: ORAL_TABLET | ORAL | 0 refills | Status: DC

## 2021-08-11 ENCOUNTER — Telehealth: Payer: Self-pay | Admitting: *Deleted

## 2021-08-11 NOTE — Telephone Encounter (Signed)
I called patient about the Prevail Study. I left message for patient to call me back if interested in the study. I will send e-mail to patient also. ?

## 2021-09-26 ENCOUNTER — Other Ambulatory Visit: Payer: Self-pay | Admitting: *Deleted

## 2021-09-26 MED ORDER — LISINOPRIL 40 MG PO TABS: ORAL_TABLET | ORAL | 0 refills | Status: DC

## 2021-11-02 ENCOUNTER — Other Ambulatory Visit: Payer: Self-pay | Admitting: Internal Medicine

## 2022-04-08 IMAGING — CT CT ABD-PELV W/O CM
2 of 4 series · 17 of 46 positions shown, 19 images · non-contrast
Comparison: 07/01/2007 CT

CLINICAL DATA: 43-year-old female with LEFT abdominal and pelvic
pain and nausea for 1 week.

EXAM:
CT ABDOMEN AND PELVIS WITHOUT CONTRAST
TECHNIQUE: Multidetector CT imaging of the abdomen and pelvis was performed
following the standard protocol without IV contrast.

[Series 3: ap without · axial · non-contrast · 0.80mm/px · z∈[+614,+1044]mm · 14 of 98 slices shown, 16 images]
[im 6/98  soft-tissue]
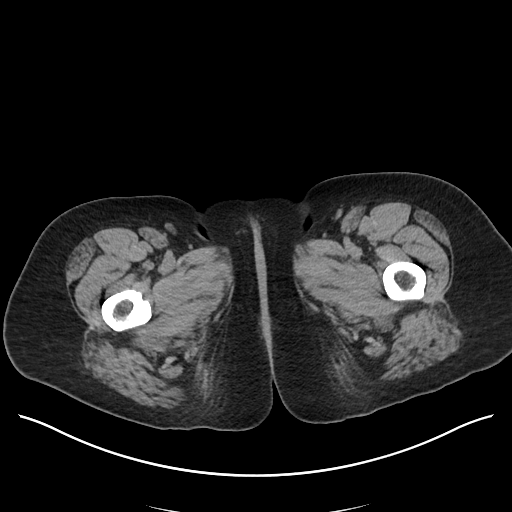
[im 6/98  bone]
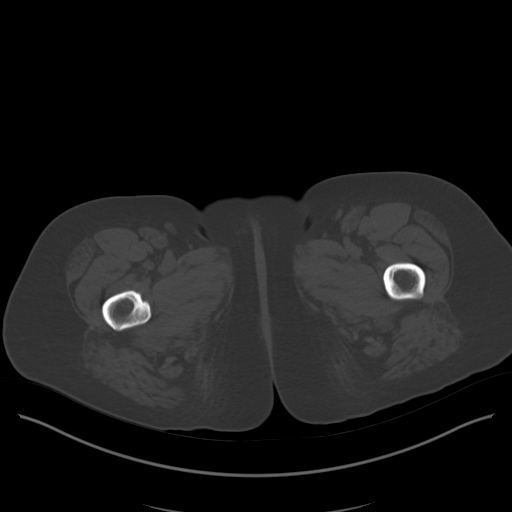
[im 11/98  soft-tissue]
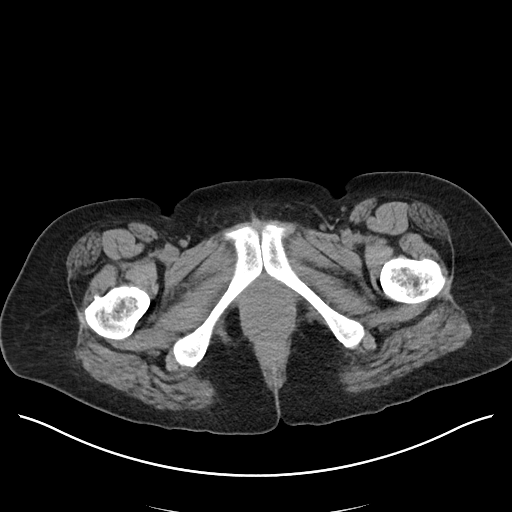
[im 21/98  soft-tissue]
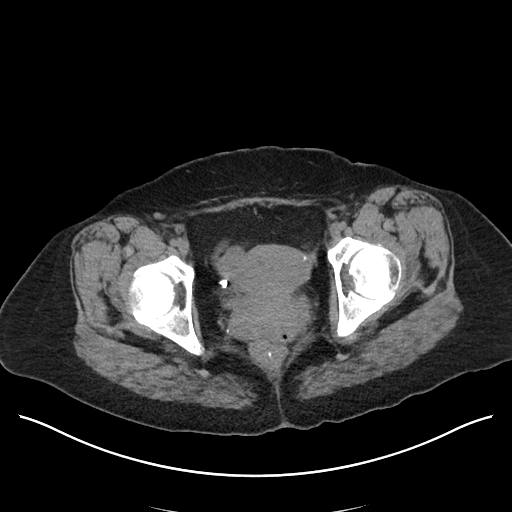
[im 26/98  soft-tissue]
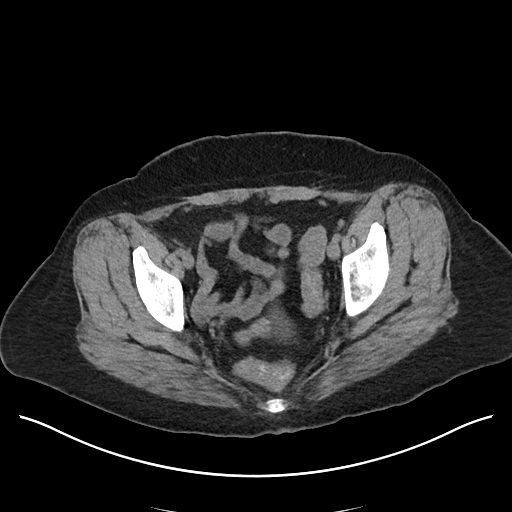
[im 31/98  soft-tissue]
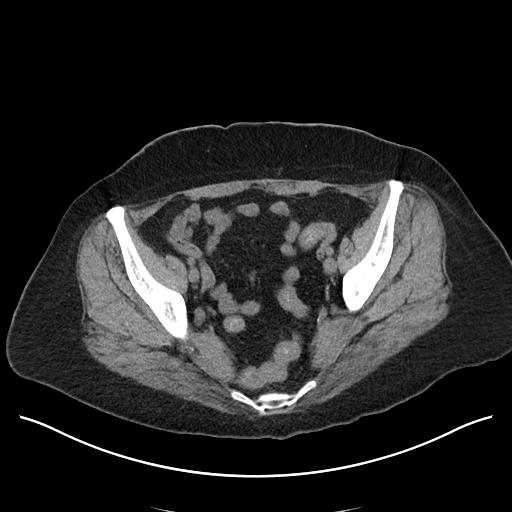
[im 41/98  soft-tissue]
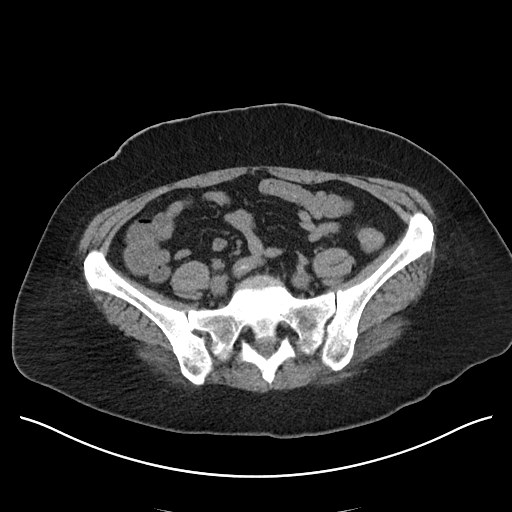
[im 46/98  soft-tissue]
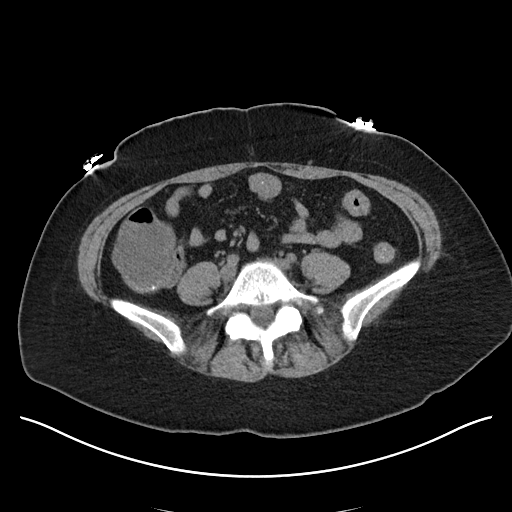
[im 52/98  soft-tissue]
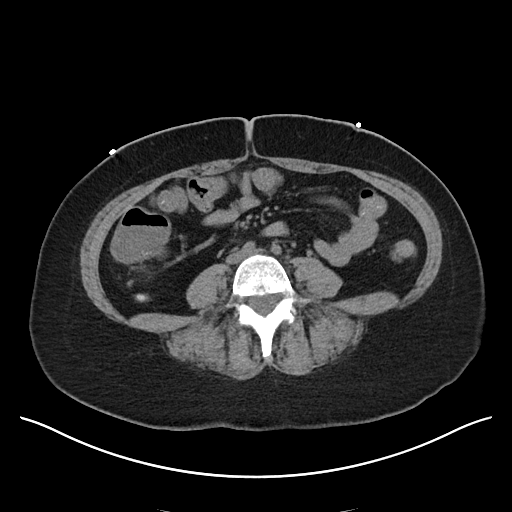
[im 57/98  soft-tissue]
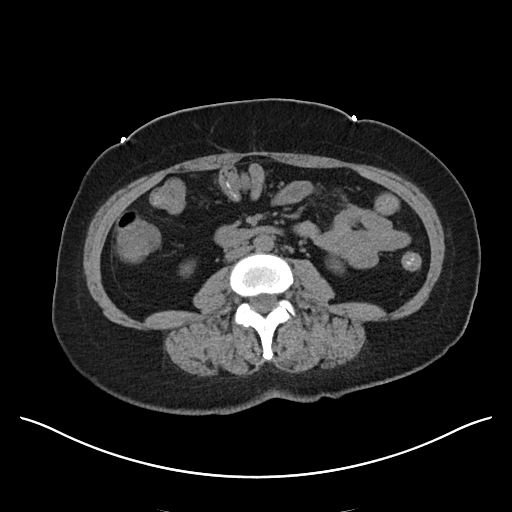
[im 57/98  bone]
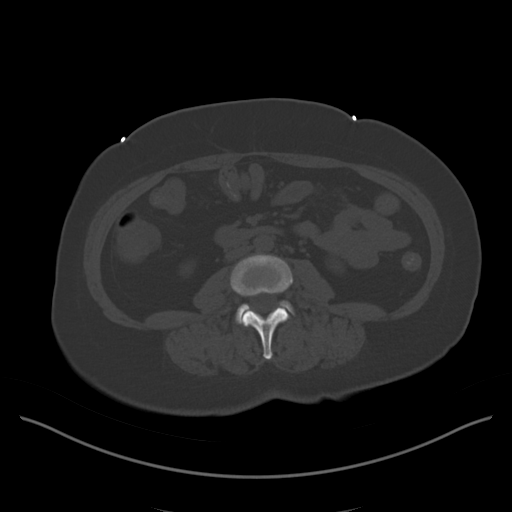
[im 67/98  soft-tissue]
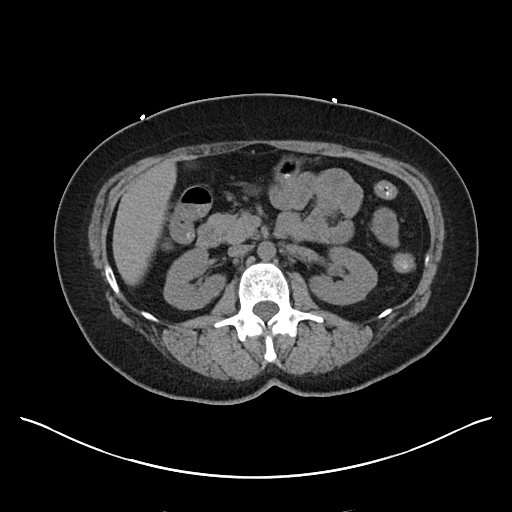
[im 72/98  soft-tissue]
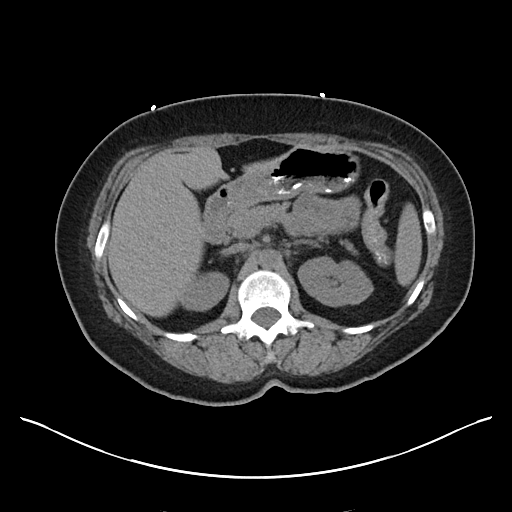
[im 77/98  soft-tissue]
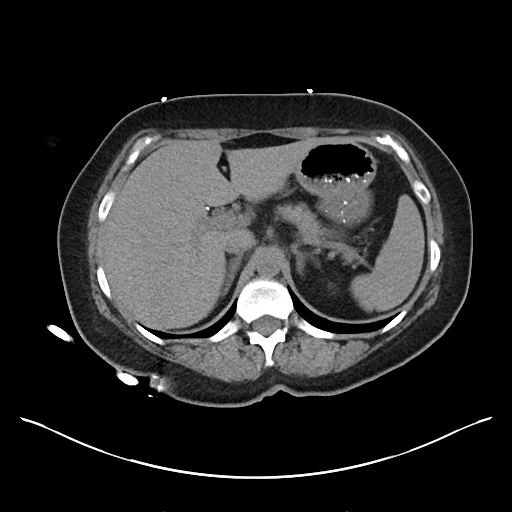
[im 87/98  soft-tissue]
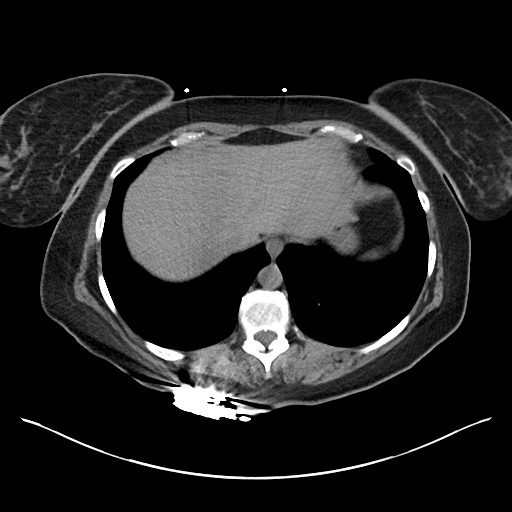
[im 92/98  soft-tissue]
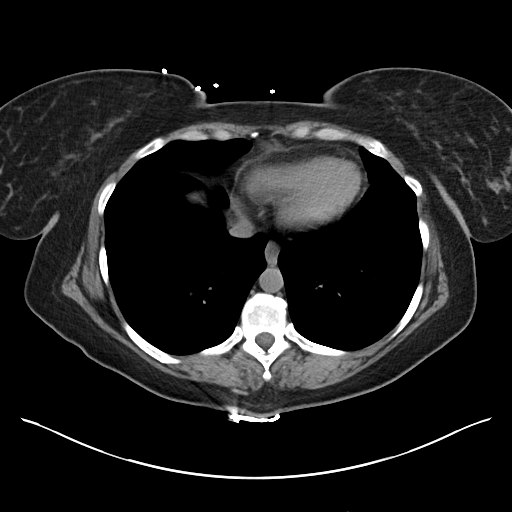

[Series 6: cor · coronal · 0.91mm/px · 3 of 105 slices shown]
[im 35/105  soft-tissue]
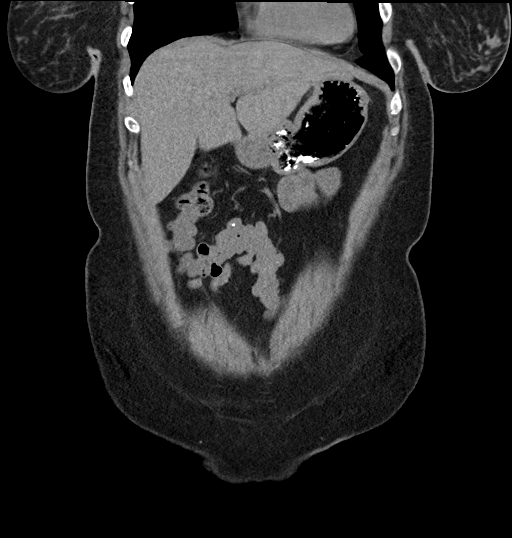
[im 47/105  soft-tissue]
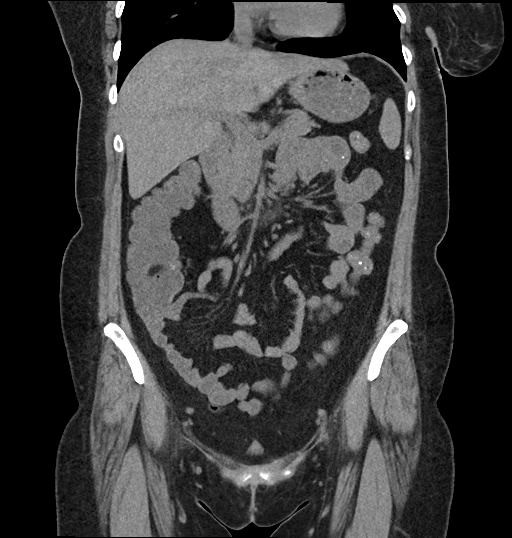
[im 58/105  soft-tissue]
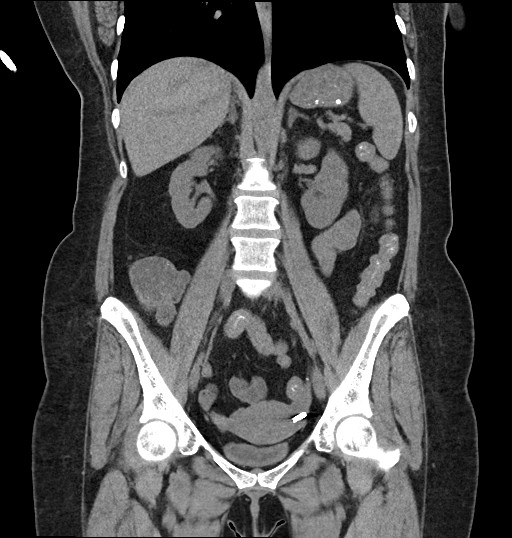

[17 of 46 positions shown; findings below may reference images not displayed]

FINDINGS: Please note that parenchymal abnormalities may be missed without
intravenous contrast.

Lower chest: No acute abnormality

Hepatobiliary: The liver is unremarkable. The patient is status post
cholecystectomy. No biliary dilatation.

Pancreas: Unremarkable

Spleen: Unremarkable

Adrenals/Urinary Tract: The kidneys, adrenal glands and bladder are
unremarkable.

Stomach/Bowel: Stomach is within normal limits. Appendix appears
normal. No evidence of bowel wall thickening, distention, or
inflammatory changes.

Vascular/Lymphatic: Aortic atherosclerosis. No enlarged abdominal or
pelvic lymph nodes.

Reproductive: Uterus and bilateral adnexa are unremarkable except
for tubal ligation clips.

Other: No ascites, pneumoperitoneum or focal collection.

Musculoskeletal: No acute or suspicious bony abnormalities.
IMPRESSION: 1. No evidence of acute abnormality or CT findings to explain this
patient's abdominal pain.
2. Aortic Atherosclerosis ([id number]).

## 2022-04-25 ENCOUNTER — Other Ambulatory Visit: Payer: Self-pay | Admitting: *Deleted

## 2022-04-25 MED ORDER — METOPROLOL SUCCINATE ER 25 MG PO TB24: ORAL_TABLET | ORAL | 0 refills | Status: DC

## 2022-05-14 ENCOUNTER — Other Ambulatory Visit: Payer: Self-pay | Admitting: Internal Medicine

## 2022-06-06 ENCOUNTER — Other Ambulatory Visit: Payer: Self-pay | Admitting: Internal Medicine

## 2022-06-25 NOTE — Progress Notes (Unsigned)
Cardiology Office Note   Date:  06/26/2022   ID:  Isabel [name], DOB 1975-07-12, MRN [id number]  PCP:  Hoy Register, MD  Cardiologist:   Dietrich Pates, MD   Pt presents for f/u of CAD    History of Present Illness: Isabel [name] is a 47 y.o. female with a history of HTN, CKD, anemia HL, hypokalemia and CAD   She is s/p NSTEMI in 12/20/18:   80% prox RCA   She is s/p DES stent   Echo showed LVEF 60 to 65   I saw the pt in 2021   She was seen by [facility] in Nov 2022  Echo LVEF and RVEF were normal      Since seen the pt has run out of lipitor, lisinopril and amlodioine   Did not have OV for over 1 year  The pt denies CP    Her breathing is up and down  SOmetimes short of breath with activity   Othertimes OK    Feels heart racing every day    Sept  had all teeth pulled   Food is in liquid form now   Getting dentures made      Since seen the pt has had 2 syncopal spells     Oct 2023  Worst spell  Asleep   Woke at 4 am   Flushed  Nauseated   Couldn't tell if  had to pee or diarrhea  Sta on toilet   Urinated   Grabbed trash can    Passed out    47 yo got her up  Not ill otherwise      Other spell a few wks ago   Similar  Sitting in bed   11 PM   Hot flash head to toe  Nauseated      Went to rest room  Never threw up or had diarrhea  On way back to bed passed out    Husband caught her   Has insurance  now        Current Meds  Medication Sig   acetaminophen (TYLENOL) 500 MG tablet Take 1,000 mg by mouth every 6 (six) hours as needed for headache (pain).    aspirin 81 MG chewable tablet Chew 1 tablet (81 mg total) by mouth daily.   ferrous sulfate 324 (65 Fe) MG TBEC Take 1 tablet (325 mg total) by mouth in the morning and at bedtime.   ibuprofen (ADVIL) 200 MG tablet Take 600 mg by mouth every 6 (six) hours as needed (pain).   metoprolol succinate (TOPROL-XL) 25 MG 24 hr tablet TAKE 1/2 TABLET(12.5 MG) BY MOUTH DAILY   nitroGLYCERIN (NITROSTAT) 0.4 MG SL tablet Place 1 tablet  (0.4 mg total) under the tongue every 5 (five) minutes x 3 doses as needed for chest pain.   ondansetron (ZOFRAN ODT) 4 MG disintegrating tablet Take 1 tablet (4 mg total) by mouth every 8 (eight) hours as needed for nausea or vomiting.   spironolactone (ALDACTONE) 25 MG tablet Take 1 tablet (25 mg total) by mouth daily.     Allergies:   Patient has no known allergies.   Past Medical History:  Diagnosis Date   CAD (coronary artery disease)    Echocardiogram 11/22: EF 55-60, no RWMA, GLS -25% (normal), normal RVSF, mod LAE, trivial MR   Hyperlipidemia LDL goal <70    Hypertension    NSTEMI (non-ST elevated myocardial infarction) (HCC) 12/19/2018   Tobacco abuse     Past  Surgical History:  Procedure Laterality Date   CESAREAN SECTION     CHOLECYSTECTOMY     CORONARY STENT INTERVENTION N/A 12/20/2018   Procedure: CORONARY STENT INTERVENTION;  Surgeon: Lorretta Harp, MD;  Location: [facility];  Service: Cardiovascular;  Laterality: N/A;   LEFT HEART CATH AND CORONARY ANGIOGRAPHY N/A 12/20/2018   Procedure: LEFT HEART CATH AND CORONARY ANGIOGRAPHY;  Surgeon: Lorretta Harp, MD;  Location: [place] [facility];  Service: Cardiovascular;  Laterality: N/A;     Social History:  The patient  reports that she has quit smoking. Her smoking use included cigarettes. She has a 2.50 pack-year smoking history. She has never used smokeless tobacco. She reports that she does not drink alcohol and does not use drugs.   Family History:  The patient's family history includes Breast cancer in her paternal grandmother; Cervical cancer in her paternal grandmother; Diabetes in her maternal grandmother and paternal grandmother; Heart attack (age of onset: 84) in her maternal grandmother; Heart attack (age of onset: 76) in her mother; Heart attack (age of onset: 60) in her paternal grandfather; Hypertension in her father, mother, paternal grandfather, paternal grandmother, and sister.    ROS:   Please see the history of present illness. All other systems are reviewed and  Negative to the above problem except as noted.    PHYSICAL EXAM: VS:  BP (!) 150/90   Pulse (!) 55   Ht 5\' 6"  (1.676 m)   Wt 168 lb (76.2 kg)   SpO2 99%   BMI 27.12 kg/m   GEN: Well nourished, well developed, in no acute distress  HEENT: normal  Neck: no JVD, carotid bruits Cardiac: RRR; no murmurs,no LE edema  Respiratory:  clear to auscultation bilaterally,No wheezing  GI: soft, nontender, nondistended, + BS  No hepatomegaly  MS: no deformity Moving all extremities   Skin: warm and dry, no rash Neuro:  Strength and sensation are intact Psych: euthymic mood, full affect   EKG:  EKG is ordered today.  SR 81    Echo   Nov 2022   1. Left ventricular ejection fraction, by estimation, is 55 to 60%. The  left ventricle has normal function. The left ventricle has no regional  wall motion abnormalities. Left ventricular diastolic parameters were  normal. The average left ventricular  global longitudinal strain is -25.0 %. The global longitudinal strain is  normal.   2. Right ventricular systolic function is normal. The right ventricular  size is normal.   3. Left atrial size was moderately dilated.   4. The mitral valve is normal in structure. Trivial mitral valve  regurgitation. No evidence of mitral stenosis.   5. The aortic valve is normal in structure. Aortic valve regurgitation is  not visualized. No aortic stenosis is present.   6. The inferior vena cava is normal in size with greater than 50%  respiratory variability, suggesting right atrial pressure of 3 mmHg.  Lipid Panel    Component Value Date/Time   CHOL 181 03/30/2021 0852   TRIG 186 (H) 03/30/2021 0852   HDL 30 (L) 03/30/2021 0852   CHOLHDL 6.0 (H) 03/30/2021 0852   CHOLHDL 4.3 12/19/2018 2354   VLDL 36 12/19/2018 2354   LDLCALC 118 (H) 03/30/2021 0852      Wt Readings from Last 3 Encounters:  06/26/22 168 lb (76.2 kg)   03/30/21 170 lb 12.8 oz (77.5 kg)  05/06/20 189 lb 12.8 oz (86.1 kg)      ASSESSMENT  AND PLAN:  1.  CAD.  Stent in July 2020. S  No symptoms of angina   I a not convinced SOB is anginal equivalent     2 Tachycardia   She feels every day  Will set up for 3 day zio to see average HR, HR range.   Stay hydrated   3   Dyslipidemia.  Need to review   Stop Repatha  She says her lipids went up    Will resume lipitor   Check in a few months   4  HTN   Resume meds   She follows at home     Will see back this spring   5 Syncope   Episodes sound like GI triggered vagal rxn    Will check labs    keep diarrhe  Recom she ask for help when feels bad soe doesn't get hurt  6  CKD    Check labs    Will refer to renal   Stay hydated      Follow-up in April     Current medicines are reviewed at length with the patient today.  The patient does not have concerns regarding medicines.  Signed, Dorris Carnes, MD  06/26/2022 10:16 AM    [address], [place], [place]  [address] Phone: [phone]; Fax: [phone]

## 2022-06-26 ENCOUNTER — Ambulatory Visit: Payer: Medicaid Other | Attending: Internal Medicine

## 2022-06-26 ENCOUNTER — Encounter: Payer: Self-pay | Admitting: Internal Medicine

## 2022-06-26 ENCOUNTER — Ambulatory Visit: Payer: Medicaid Other | Attending: Internal Medicine | Admitting: Internal Medicine

## 2022-06-26 VITALS — BP 150/90 | HR 55 | Ht 66.0 in | Wt 168.0 lb

## 2022-06-26 DIAGNOSIS — Z79899 Other long term (current) drug therapy: Secondary | ICD-10-CM | POA: Diagnosis not present

## 2022-06-26 DIAGNOSIS — R000 Tachycardia, unspecified: Secondary | ICD-10-CM | POA: Diagnosis not present

## 2022-06-26 DIAGNOSIS — D649 Anemia, unspecified: Secondary | ICD-10-CM

## 2022-06-26 DIAGNOSIS — N1832 Chronic kidney disease, stage 3b: Secondary | ICD-10-CM

## 2022-06-26 DIAGNOSIS — I2510 Atherosclerotic heart disease of native coronary artery without angina pectoris: Secondary | ICD-10-CM

## 2022-06-26 DIAGNOSIS — E782 Mixed hyperlipidemia: Secondary | ICD-10-CM | POA: Diagnosis not present

## 2022-06-26 DIAGNOSIS — R0602 Shortness of breath: Secondary | ICD-10-CM | POA: Diagnosis not present

## 2022-06-26 DIAGNOSIS — I10 Essential (primary) hypertension: Secondary | ICD-10-CM | POA: Diagnosis not present

## 2022-06-26 MED ORDER — AMLODIPINE BESYLATE 2.5 MG PO TABS: 2.5000 mg | ORAL_TABLET | Freq: Every day | ORAL | 3 refills | Status: DC

## 2022-06-26 MED ORDER — SPIRONOLACTONE 25 MG PO TABS: 25.0000 mg | ORAL_TABLET | Freq: Every day | ORAL | 3 refills | Status: DC

## 2022-06-26 MED ORDER — ATORVASTATIN CALCIUM 20 MG PO TABS: 20.0000 mg | ORAL_TABLET | ORAL | 11 refills | Status: DC

## 2022-06-26 MED ORDER — LISINOPRIL 40 MG PO TABS: ORAL_TABLET | ORAL | 3 refills | Status: DC

## 2022-06-26 NOTE — Patient Instructions (Addendum)
Medication Instructions:  TAKE AMLODIPINE, ATORVASTATIN, LISINOPRIL, AND SPIRONOLACTONE ALL SENT TO YOUR PHARMACY TODAY   *If you need a refill on your cardiac medications before your next appointment, please call your pharmacy*   Lab Work: CBC, CMET, HGBA1C, NMR, TSH TODAY  If you have labs (blood work) drawn today and your tests are completely normal, you will receive your results only by: MyChart Message (if you have MyChart) OR A paper copy in the mail If you have any lab test that is abnormal or we need to change your treatment, we will call you to review the results.   Testing/Procedures:  [facility]- Long Term Monitor Instructions  Your physician has requested you wear a ZIO patch monitor for 3 days.  This is a single patch monitor. [employer] supplies one patch monitor per enrollment. Additional stickers are not available. Please do not apply patch if you will be having a Nuclear Stress Test,  Echocardiogram, Cardiac CT, MRI, or Chest Xray during the period you would be wearing the  monitor. The patch cannot be worn during these tests. You cannot remove and re-apply the  ZIO XT patch monitor.  Your ZIO patch monitor will be mailed 3 day USPS to your address on file. It may take 3-5 days  to receive your monitor after you have been enrolled.  Once you have received your monitor, please review the enclosed instructions. Your monitor  has already been registered assigning a specific monitor serial # to you.  Billing and Patient Assistance Program Information  We have supplied [employer] with any of your insurance information on file for billing purposes. [employer] offers a sliding scale Patient Assistance Program for patients that do not have  insurance, or whose insurance does not completely cover the cost of the ZIO monitor.  You must apply for the Patient Assistance Program to qualify for this discounted rate.  To apply, please call [employer] at [phone], select option 4, select  option 2, ask to apply for  Patient Assistance Program. Theodore Demark will ask your household income, and how many people  are in your household. They will quote your out-of-pocket cost based on that information.  [employer] will also be able to set up a 29-month, interest-free payment plan if needed.  Applying the monitor   Shave hair from upper left chest.  Hold abrader disc by orange tab. Rub abrader in 40 strokes over the upper left chest as  indicated in your monitor instructions.  Clean area with 4 enclosed alcohol pads. Let dry.  Apply patch as indicated in monitor instructions. Patch will be placed under collarbone on left  side of chest with arrow pointing upward.  Rub patch adhesive wings for 2 minutes. Remove white label marked "1". Remove the white  label marked "2". Rub patch adhesive wings for 2 additional minutes.  While looking in a mirror, press and release button in center of patch. A small green light will  flash 3-4 times. This will be your only indicator that the monitor has been turned on.  Do not shower for the first 24 hours. You may shower after the first 24 hours.  Press the button if you feel a symptom. You will hear a small click. Record Date, Time and  Symptom in the Patient Logbook.  When you are ready to remove the patch, follow instructions on the last 2 pages of Patient  Logbook. Stick patch monitor onto the last page of Patient Logbook.  Place Patient Logbook in the blue and  white box. Use locking tab on box and tape box closed  securely. The blue and white box has prepaid postage on it. Please place it in the mailbox as  soon as possible. Your physician should have your test results approximately 7 days after the  monitor has been mailed back to [facility].  Call [place] at [phone] if you have questions regarding  your ZIO XT patch monitor. Call them immediately if you see an orange light blinking on your  monitor.  If your monitor  falls off in less than 4 days, contact our Monitor department at [phone].  If your monitor becomes loose or falls off after 4 days call [employer] at [phone] for  suggestions on securing your monitor    Follow-Up: At [facility], you and your health needs are our priority.  As part of our continuing mission to provide you with exceptional heart care, we have created designated Provider Care Teams.  These Care Teams include your primary Cardiologist (physician) and Advanced Practice Providers (APPs -  Physician Assistants and Nurse Practitioners) who all work together to provide you with the care you need, when you need it.  We recommend signing up for the patient portal called "MyChart".  Sign up information is provided on this After Visit Summary.  MyChart is used to connect with patients for Virtual Visits (Telemedicine).  Patients are able to view lab/test results, encounter notes, upcoming appointments, etc.  Non-urgent messages can be sent to your provider as well.   To learn more about what you can do with MyChart, go to [email or web address].    Your next appointment:   6-8 week(s)  Provider:   Dorris Carnes, MD     Other Instructions: STAY WELL HYDRATED

## 2022-06-26 NOTE — Progress Notes (Unsigned)
Enrolled for [employer] to mail a ZIO XT long term holter monitor to the patients address on file.  

## 2022-06-27 LAB — COMPREHENSIVE METABOLIC PANEL
ALT: 102 IU/L — ABNORMAL HIGH (ref 0–32)
AST: 31 IU/L (ref 0–40)
Albumin/Globulin Ratio: 1.5 (ref 1.2–2.2)
Albumin: 4 g/dL (ref 3.9–4.9)
Alkaline Phosphatase: 87 IU/L (ref 44–121)
BUN/Creatinine Ratio: 12 (ref 9–23)
BUN: 14 mg/dL (ref 6–24)
Bilirubin Total: 0.3 mg/dL (ref 0.0–1.2)
CO2: 17 mmol/L — ABNORMAL LOW (ref 20–29)
Calcium: 9.2 mg/dL (ref 8.7–10.2)
Chloride: 109 mmol/L — ABNORMAL HIGH (ref 96–106)
Creatinine, Ser: 1.19 mg/dL — ABNORMAL HIGH (ref 0.57–1.00)
Globulin, Total: 2.6 g/dL (ref 1.5–4.5)
Glucose: 99 mg/dL (ref 70–99)
Potassium: 3.9 mmol/L (ref 3.5–5.2)
Sodium: 143 mmol/L (ref 134–144)
Total Protein: 6.6 g/dL (ref 6.0–8.5)
eGFR: 57 mL/min/{1.73_m2} — ABNORMAL LOW (ref >59)

## 2022-06-27 LAB — NMR, LIPOPROFILE
Cholesterol, Total: 154 mg/dL (ref 100–199)
HDL Particle Number: 18.1 umol/L — ABNORMAL LOW (ref >=30.5)
HDL-C: 33 mg/dL — ABNORMAL LOW (ref >39)
LDL Particle Number: 1032 nmol/L — ABNORMAL HIGH (ref <1000)
LDL Size: 21.3 nm (ref >20.5)
LDL-C (NIH Calc): 96 mg/dL (ref 0–99)
LP-IR Score: <25 (ref <=45)
Small LDL Particle Number: 257 nmol/L (ref <=527)
Triglycerides: 143 mg/dL (ref 0–149)

## 2022-06-27 LAB — CBC
Hematocrit: 37.9 % (ref 34.0–46.6)
Hemoglobin: 12 g/dL (ref 11.1–15.9)
MCH: 28.8 pg (ref 26.6–33.0)
MCHC: 31.7 g/dL (ref 31.5–35.7)
MCV: 91 fL (ref 79–97)
Platelets: 242 10*3/uL (ref 150–450)
RBC: 4.17 x10E6/uL (ref 3.77–5.28)
RDW: 13.9 % (ref 11.7–15.4)
WBC: 7.6 10*3/uL (ref 3.4–10.8)

## 2022-06-27 LAB — TSH: TSH: 0.071 u[IU]/mL — ABNORMAL LOW (ref 0.450–4.500)

## 2022-06-27 LAB — HEMOGLOBIN A1C
Est. average glucose Bld gHb Est-mCnc: 100 mg/dL
Hgb A1c MFr Bld: 5.1 % (ref 4.8–5.6)

## 2022-06-29 ENCOUNTER — Telehealth: Payer: Self-pay

## 2022-06-29 DIAGNOSIS — E782 Mixed hyperlipidemia: Secondary | ICD-10-CM

## 2022-06-29 DIAGNOSIS — R000 Tachycardia, unspecified: Secondary | ICD-10-CM | POA: Diagnosis not present

## 2022-06-29 DIAGNOSIS — Z79899 Other long term (current) drug therapy: Secondary | ICD-10-CM

## 2022-06-29 DIAGNOSIS — R7989 Other specified abnormal findings of blood chemistry: Secondary | ICD-10-CM

## 2022-06-29 DIAGNOSIS — I2510 Atherosclerotic heart disease of native coronary artery without angina pectoris: Secondary | ICD-10-CM

## 2022-06-29 MED ORDER — ATORVASTATIN CALCIUM 20 MG PO TABS: 20.0000 mg | ORAL_TABLET | Freq: Every day | ORAL | 3 refills | Status: DC

## 2022-06-29 NOTE — Telephone Encounter (Signed)
-----  Message from Fay Records, MD sent at 06/27/2022  1:41 PM EST ----- CBC is normal Cr is much much better than previous   NOw 1.19    Will follow  TSH is abnormal   I would get free T3, free T4  Repeat BMET to confirm Lipids need to be better  WOuld incease lipior to 40mg    Repeat lipids in 8 wks with AST

## 2022-06-29 NOTE — Telephone Encounter (Signed)
Pt advised her lab results and says she has not been taking the Lipitor 20 mg since last April... several months ago... she will start the 20 mg back and will have labs at her OV 08/24/22 per her request to minimize appts.   Pt to have thyroid and BMET 07/03/22.

## 2022-07-03 ENCOUNTER — Ambulatory Visit: Payer: Medicaid Other | Attending: Internal Medicine

## 2022-07-03 DIAGNOSIS — R7989 Other specified abnormal findings of blood chemistry: Secondary | ICD-10-CM | POA: Diagnosis not present

## 2022-07-03 DIAGNOSIS — Z79899 Other long term (current) drug therapy: Secondary | ICD-10-CM | POA: Diagnosis not present

## 2022-07-03 DIAGNOSIS — E782 Mixed hyperlipidemia: Secondary | ICD-10-CM

## 2022-07-03 DIAGNOSIS — I2510 Atherosclerotic heart disease of native coronary artery without angina pectoris: Secondary | ICD-10-CM | POA: Diagnosis not present

## 2022-07-04 LAB — BASIC METABOLIC PANEL
BUN/Creatinine Ratio: 15 (ref 9–23)
BUN: 17 mg/dL (ref 6–24)
CO2: 22 mmol/L (ref 20–29)
Calcium: 9.5 mg/dL (ref 8.7–10.2)
Chloride: 104 mmol/L (ref 96–106)
Creatinine, Ser: 1.12 mg/dL — ABNORMAL HIGH (ref 0.57–1.00)
Glucose: 100 mg/dL — ABNORMAL HIGH (ref 70–99)
Potassium: 4.4 mmol/L (ref 3.5–5.2)
Sodium: 137 mmol/L (ref 134–144)
eGFR: 61 mL/min/{1.73_m2} (ref >59)

## 2022-07-04 LAB — T3, FREE: T3, Free: 2.8 pg/mL (ref 2.0–4.4)

## 2022-07-04 LAB — T4, FREE: Free T4: 1.23 ng/dL (ref 0.82–1.77)

## 2022-07-13 ENCOUNTER — Other Ambulatory Visit: Payer: Self-pay | Admitting: Internal Medicine

## 2022-07-13 MED ORDER — METOPROLOL SUCCINATE ER 25 MG PO TB24: ORAL_TABLET | ORAL | 3 refills | Status: DC

## 2022-07-13 NOTE — Addendum Note (Signed)
Addended by: Michelle Nasuti on: 07/13/2022 08:26 AM   Modules accepted: Orders

## 2022-08-22 NOTE — Progress Notes (Unsigned)
Cardiology Office Note   Date:  08/24/2022   ID:  ATHIRA BOHON, DOB 06-06-1975, MRN [id number]  PCP:  Charlott Rakes, MD  Cardiologist:   Dorris Carnes, MD   Pt presents for f/u of CAD    History of Present Illness: Isabel [name] is a 47 y.o. female with a history of HTN, CKD, anemia HL, hypokalemia and CAD   She is s/p NSTEMI in 12/20/18:   80% prox RCA   She is s/p DES stent   Echo showed LVEF 60 to 65   2022  Echo LVEF and RVEF were normal      I saw the pt last Jan 2024    She had 2 syncopal spells  Both sounded vagal     Has been noncompliant with meds in past when she had no inscurance     I saw the pt in Jan 2024  Since seen she now says her breathing has not been right since Covid 2 years ago   Erratic   Some days can walk in house   No problems   Other days has to sit down   No CP or any symptoms like when had MI in 2020  Patient says N/V now  Worse than previous    Has had some dizzy spells   Last night in shower    had to turn water to cold   Got flush, warm prior   Nauseated   Current Meds  Medication Sig   acetaminophen (TYLENOL) 500 MG tablet Take 1,000 mg by mouth every 6 (six) hours as needed for headache (pain).    amLODipine (NORVASC) 2.5 MG tablet Take 1 tablet (2.5 mg total) by mouth daily.   aspirin 81 MG chewable tablet Chew 1 tablet (81 mg total) by mouth daily.   atorvastatin (LIPITOR) 20 MG tablet Take 1 tablet (20 mg total) by mouth daily.   ferrous sulfate 324 (65 Fe) MG TBEC Take 1 tablet (325 mg total) by mouth in the morning and at bedtime.   ibuprofen (ADVIL) 200 MG tablet Take 600 mg by mouth every 6 (six) hours as needed (pain).   lisinopril (ZESTRIL) 40 MG tablet TAKE 1/2 TABLET(20 MG) BY MOUTH TWICE DAILY   metoprolol succinate (TOPROL-XL) 25 MG 24 hr tablet TAKE 1/2 TABLET(12.5 MG) BY MOUTH DAILY. Please keep scheduled appointment for future refills. Thank you.   nitroGLYCERIN (NITROSTAT) 0.4 MG SL tablet Place 1 tablet (0.4 mg total)  under the tongue every 5 (five) minutes x 3 doses as needed for chest pain.   ondansetron (ZOFRAN ODT) 4 MG disintegrating tablet Take 1 tablet (4 mg total) by mouth every 8 (eight) hours as needed for nausea or vomiting.   spironolactone (ALDACTONE) 25 MG tablet Take 1 tablet (25 mg total) by mouth daily.     Allergies:   Patient has no known allergies.   Past Medical History:  Diagnosis Date   CAD (coronary artery disease)    Echocardiogram 11/22: EF 55-60, no RWMA, GLS -25% (normal), normal RVSF, mod LAE, trivial MR   Hyperlipidemia LDL goal <70    Hypertension    NSTEMI (non-ST elevated myocardial infarction) ([place]) 12/19/2018   Tobacco abuse     Past Surgical History:  Procedure Laterality Date   CESAREAN SECTION     CHOLECYSTECTOMY     CORONARY STENT INTERVENTION N/A 12/20/2018   Procedure: CORONARY STENT INTERVENTION;  Surgeon: Lorretta Harp, MD;  Location: [place] [facility];  Service:  Cardiovascular;  Laterality: N/A;   LEFT HEART CATH AND CORONARY ANGIOGRAPHY N/A 12/20/2018   Procedure: LEFT HEART CATH AND CORONARY ANGIOGRAPHY;  Surgeon: Lorretta Harp, MD;  Location: [place] [facility];  Service: Cardiovascular;  Laterality: N/A;     Social History:  The patient  reports that she has quit smoking. Her smoking use included cigarettes. She has a 2.50 pack-year smoking history. She has never used smokeless tobacco. She reports that she does not drink alcohol and does not use drugs.   Family History:  The patient's family history includes Breast cancer in her paternal grandmother; Cervical cancer in her paternal grandmother; Diabetes in her maternal grandmother and paternal grandmother; Heart attack (age of onset: 66) in her maternal grandmother; Heart attack (age of onset: 30) in her mother; Heart attack (age of onset: 70) in her paternal grandfather; Hypertension in her father, mother, paternal grandfather, paternal grandmother, and sister.    ROS:  Please see the  history of present illness. All other systems are reviewed and  Negative to the above problem except as noted.    PHYSICAL EXAM: VS:  BP 118/72   Pulse (!) 54   Ht 5\' 6"  (1.676 m)   Wt 155 lb 3.2 oz (70.4 kg)   SpO2 90%   BMI 25.05 kg/m   GEN: Well nourished, well developed, in no acute distress  HEENT: normal  Neck: no JVD, carotid bruit Cardiac: RRR; no murmur  No LE edema  Respiratory:  clear to auscultation  GI: soft, nontender, nondistended No hepatomegaly   EKG:  EKG not done   Zio patch   Jan 2024  Patch Wear Time:  3 days and 0 hours (2024-02-01T17:53:02-499 to 2024-02-04T18:01:59-0500)   Impression:    Sinus rhythm   Rates 40 to 137 bpm  Average HR 61 bpm Rare PVC, PAC. Diary entry corresponded to [place] with PVC    Echo   Nov 2022   1. Left ventricular ejection fraction, by estimation, is 55 to 60%. The  left ventricle has normal function. The left ventricle has no regional  wall motion abnormalities. Left ventricular diastolic parameters were  normal. The average left ventricular  global longitudinal strain is -25.0 %. The global longitudinal strain is  normal.   2. Right ventricular systolic function is normal. The right ventricular  size is normal.   3. Left atrial size was moderately dilated.   4. The mitral valve is normal in structure. Trivial mitral valve  regurgitation. No evidence of mitral stenosis.   5. The aortic valve is normal in structure. Aortic valve regurgitation is  not visualized. No aortic stenosis is present.   6. The inferior vena cava is normal in size with greater than 50%  respiratory variability, suggesting right atrial pressure of 3 mmHg.  Lipid Panel    Component Value Date/Time   CHOL 181 03/30/2021 0852   TRIG 186 (H) 03/30/2021 0852   HDL 30 (L) 03/30/2021 0852   CHOLHDL 6.0 (H) 03/30/2021 0852   CHOLHDL 4.3 12/19/2018 2354   VLDL 36 12/19/2018 2354   LDLCALC 118 (H) 03/30/2021 0852      Wt Readings from Last 3  Encounters:  08/24/22 155 lb 3.2 oz (70.4 kg)  06/26/22 168 lb (76.2 kg)  03/30/21 170 lb 12.8 oz (77.5 kg)      ASSESSMENT AND PLAN:  1.  CAD.  Stent in July 2020.  No symptoms like at that time      2 Tachycardia  Monitor showed no tachycardia    She is feeling better on meds  3   Dyslipidemia.  =Recheck now that on meds   4  HTN   BP is controlled on meds   5 Syncope   Has had some dizziness but now  Vagally    Having stomach issues   Not eating as much    6  CKD   Check CMET   7  GI  Signficant symptoms   N/V   Diarrhea   Full   Needs to go back to   Cehck CBC, CMET, TSH, Lipomed, Vit D ANA and ESR  F/U in September    Current medicines are reviewed at length with the patient today.  The patient does not have concerns regarding medicines.  Signed, Dorris Carnes, MD  08/24/2022 9:10 AM    [address], [place], [place]  [address] Phone: [phone]; Fax: [phone]

## 2022-08-24 ENCOUNTER — Ambulatory Visit: Payer: Medicaid Other | Attending: Internal Medicine | Admitting: Internal Medicine

## 2022-08-24 ENCOUNTER — Ambulatory Visit: Payer: Medicaid Other

## 2022-08-24 ENCOUNTER — Other Ambulatory Visit: Payer: Medicaid Other

## 2022-08-24 ENCOUNTER — Encounter: Payer: Self-pay | Admitting: Internal Medicine

## 2022-08-24 VITALS — BP 118/72 | HR 54 | Ht 66.0 in | Wt 155.2 lb

## 2022-08-24 DIAGNOSIS — I10 Essential (primary) hypertension: Secondary | ICD-10-CM

## 2022-08-24 DIAGNOSIS — Z79899 Other long term (current) drug therapy: Secondary | ICD-10-CM

## 2022-08-24 DIAGNOSIS — R0602 Shortness of breath: Secondary | ICD-10-CM

## 2022-08-24 DIAGNOSIS — I2510 Atherosclerotic heart disease of native coronary artery without angina pectoris: Secondary | ICD-10-CM

## 2022-08-24 DIAGNOSIS — E785 Hyperlipidemia, unspecified: Secondary | ICD-10-CM | POA: Diagnosis not present

## 2022-08-24 DIAGNOSIS — R7989 Other specified abnormal findings of blood chemistry: Secondary | ICD-10-CM

## 2022-08-24 DIAGNOSIS — E782 Mixed hyperlipidemia: Secondary | ICD-10-CM | POA: Diagnosis not present

## 2022-08-24 DIAGNOSIS — N1832 Chronic kidney disease, stage 3b: Secondary | ICD-10-CM

## 2022-08-24 NOTE — Patient Instructions (Signed)
Medication Instructions:  Your physician recommends that you continue on your current medications as directed. Please refer to the Current Medication list given to you today.  *If you need a refill on your cardiac medications before your next appointment, please call your pharmacy*   Lab Work: CBC, CMET, TSH, Lip Med panel, Vit D  If you have labs (blood work) drawn today and your tests are completely normal, you will receive your results only by: [place] (if you have MyChart) OR A paper copy in the mail If you have any lab test that is abnormal or we need to change your treatment, we will call you to review the results.    Follow-Up: At [facility], you and your health needs are our priority.  As part of our continuing mission to provide you with exceptional heart care, we have created designated Provider Care Teams.  These Care Teams include your primary Cardiologist (physician) and Advanced Practice Providers (APPs -  Physician Assistants and Nurse Practitioners) who all work together to provide you with the care you need, when you need it.  We recommend signing up for the patient portal called "MyChart".  Sign up information is provided on this After Visit Summary.  MyChart is used to connect with patients for Virtual Visits (Telemedicine).  Patients are able to view lab/test results, encounter notes, upcoming appointments, etc.  Non-urgent messages can be sent to your provider as well.   To learn more about what you can do with MyChart, go to [email or web address].    Your next appointment:   6 month(s)  Provider:   Dorris Carnes, MD

## 2022-08-25 LAB — LIPID PANEL
Chol/HDL Ratio: 4.2 ratio (ref 0.0–4.4)
Cholesterol, Total: 161 mg/dL (ref 100–199)
HDL: 38 mg/dL — ABNORMAL LOW (ref >39)
LDL Chol Calc (NIH): 104 mg/dL — ABNORMAL HIGH (ref 0–99)
Triglycerides: 102 mg/dL (ref 0–149)
VLDL Cholesterol Cal: 19 mg/dL (ref 5–40)

## 2022-08-25 LAB — AST: AST: 57 IU/L — ABNORMAL HIGH (ref 0–40)

## 2022-08-29 ENCOUNTER — Other Ambulatory Visit: Payer: Self-pay

## 2022-08-29 DIAGNOSIS — I10 Essential (primary) hypertension: Secondary | ICD-10-CM

## 2022-08-29 DIAGNOSIS — E782 Mixed hyperlipidemia: Secondary | ICD-10-CM

## 2022-08-29 DIAGNOSIS — E785 Hyperlipidemia, unspecified: Secondary | ICD-10-CM

## 2022-08-29 DIAGNOSIS — Z79899 Other long term (current) drug therapy: Secondary | ICD-10-CM

## 2022-08-29 DIAGNOSIS — I2510 Atherosclerotic heart disease of native coronary artery without angina pectoris: Secondary | ICD-10-CM

## 2022-08-29 MED ORDER — EZETIMIBE 10 MG PO TABS: 10.0000 mg | ORAL_TABLET | Freq: Every day | ORAL | 3 refills | Status: DC

## 2022-08-30 LAB — NMR, LIPOPROFILE
Cholesterol, Total: 166 mg/dL (ref 100–199)
HDL Particle Number: 21.3 umol/L — ABNORMAL LOW (ref >=30.5)
HDL-C: 42 mg/dL (ref >39)
LDL Particle Number: 1310 nmol/L — ABNORMAL HIGH (ref <1000)
LDL Size: 21 nm (ref >20.5)
LDL-C (NIH Calc): 106 mg/dL — ABNORMAL HIGH (ref 0–99)
LP-IR Score: 43 (ref <=45)
Small LDL Particle Number: 637 nmol/L — ABNORMAL HIGH (ref <=527)
Triglycerides: 99 mg/dL (ref 0–149)

## 2022-08-30 LAB — COMPREHENSIVE METABOLIC PANEL
ALT: 130 IU/L — ABNORMAL HIGH (ref 0–32)
AST: 56 IU/L — ABNORMAL HIGH (ref 0–40)
Albumin/Globulin Ratio: 1.5 (ref 1.2–2.2)
Albumin: 4.1 g/dL (ref 3.9–4.9)
Alkaline Phosphatase: 105 IU/L (ref 44–121)
BUN/Creatinine Ratio: 12 (ref 9–23)
BUN: 17 mg/dL (ref 6–24)
Bilirubin Total: 0.4 mg/dL (ref 0.0–1.2)
CO2: 16 mmol/L — ABNORMAL LOW (ref 20–29)
Calcium: 9.4 mg/dL (ref 8.7–10.2)
Chloride: 108 mmol/L — ABNORMAL HIGH (ref 96–106)
Creatinine, Ser: 1.43 mg/dL — ABNORMAL HIGH (ref 0.57–1.00)
Globulin, Total: 2.7 g/dL (ref 1.5–4.5)
Glucose: 94 mg/dL (ref 70–99)
Potassium: 4 mmol/L (ref 3.5–5.2)
Sodium: 139 mmol/L (ref 134–144)
Total Protein: 6.8 g/dL (ref 6.0–8.5)
eGFR: 46 mL/min/{1.73_m2} — ABNORMAL LOW (ref >59)

## 2022-08-30 LAB — CBC
Hematocrit: 36.9 % (ref 34.0–46.6)
Hemoglobin: 12.3 g/dL (ref 11.1–15.9)
MCH: 30.1 pg (ref 26.6–33.0)
MCHC: 33.3 g/dL (ref 31.5–35.7)
MCV: 90 fL (ref 79–97)
Platelets: 321 10*3/uL (ref 150–450)
RBC: 4.08 x10E6/uL (ref 3.77–5.28)
RDW: 14 % (ref 11.7–15.4)
WBC: 6.7 10*3/uL (ref 3.4–10.8)

## 2022-08-30 LAB — SEDIMENTATION RATE: Sed Rate: 13 mm/hr (ref 0–32)

## 2022-08-30 LAB — LIPOPROTEIN A (LPA): Lipoprotein (a): 128.2 nmol/L — ABNORMAL HIGH (ref <75.0)

## 2022-08-30 LAB — VITAMIN D 25 HYDROXY (VIT D DEFICIENCY, FRACTURES): Vit D, 25-Hydroxy: 26.9 ng/mL — ABNORMAL LOW (ref 30.0–100.0)

## 2022-08-30 LAB — URIC ACID: Uric Acid: 5.1 mg/dL (ref 2.6–6.2)

## 2022-08-30 LAB — TSH: TSH: 0.332 u[IU]/mL — ABNORMAL LOW (ref 0.450–4.500)

## 2022-08-30 LAB — APOLIPOPROTEIN B: Apolipoprotein B: 93 mg/dL — ABNORMAL HIGH (ref <90)

## 2022-08-31 ENCOUNTER — Telehealth: Payer: Self-pay

## 2022-08-31 ENCOUNTER — Other Ambulatory Visit: Payer: Self-pay

## 2022-08-31 DIAGNOSIS — Z79899 Other long term (current) drug therapy: Secondary | ICD-10-CM

## 2022-08-31 DIAGNOSIS — E559 Vitamin D deficiency, unspecified: Secondary | ICD-10-CM

## 2022-08-31 MED ORDER — HM VITAMIN D3 100 MCG (4000 UT) PO CAPS: 4000.0000 [IU] | ORAL_CAPSULE | Freq: Every day | ORAL | 3 refills | Status: AC

## 2022-08-31 MED ORDER — ATORVASTATIN CALCIUM 20 MG PO TABS: 20.0000 mg | ORAL_TABLET | ORAL | 3 refills | Status: DC

## 2022-08-31 NOTE — Telephone Encounter (Signed)
Previous lab note: LDL 104   SHould be lower    I would add Zetia to regimen  Follow up lipomed panel in 8 wks with liver panel   Pt was not already on Zetia... it was just added... she says she does not want to do the Omro but will try the Zetia and have labs 11/14/22.   Pt Creat and TSH sent to her PCP.   Pt is going to start Vit D 4000 u and have rechecked at her next lab appt.

## 2022-08-31 NOTE — Telephone Encounter (Signed)
-----   Message from Fay Records, MD sent at 08/31/2022  2:08 PM EDT ----- Cr is 1.43  Level has been labile   1.2 to 2.0    Needs to be follow closely  LDL is 106  Too high given coronary artery disease   PT on a statin and Zetia Would recomm Repatha every 2 wks   Follow up lpomed and liver panel in 8 wks

## 2022-10-09 ENCOUNTER — Telehealth: Payer: Self-pay | Admitting: Internal Medicine

## 2022-10-09 ENCOUNTER — Encounter (HOSPITAL_COMMUNITY): Payer: Self-pay | Admitting: Emergency Medicine

## 2022-10-09 ENCOUNTER — Other Ambulatory Visit: Payer: Self-pay

## 2022-10-09 ENCOUNTER — Emergency Department (HOSPITAL_COMMUNITY): Payer: Medicaid Other

## 2022-10-09 ENCOUNTER — Emergency Department (HOSPITAL_COMMUNITY)
Admission: EM | Admit: 2022-10-09 | Discharge: 2022-10-09 | Disposition: A | Discharge status: Home / Self Care | Payer: Medicaid Other | Attending: Emergency Medicine | Admitting: Emergency Medicine

## 2022-10-09 DIAGNOSIS — E876 Hypokalemia: Secondary | ICD-10-CM | POA: Diagnosis not present

## 2022-10-09 DIAGNOSIS — R079 Chest pain, unspecified: Secondary | ICD-10-CM | POA: Diagnosis not present

## 2022-10-09 DIAGNOSIS — Z7982 Long term (current) use of aspirin: Secondary | ICD-10-CM | POA: Insufficient documentation

## 2022-10-09 DIAGNOSIS — M549 Dorsalgia, unspecified: Secondary | ICD-10-CM | POA: Diagnosis not present

## 2022-10-09 DIAGNOSIS — R1012 Left upper quadrant pain: Secondary | ICD-10-CM | POA: Diagnosis not present

## 2022-10-09 DIAGNOSIS — R112 Nausea with vomiting, unspecified: Secondary | ICD-10-CM | POA: Diagnosis not present

## 2022-10-09 DIAGNOSIS — R109 Unspecified abdominal pain: Secondary | ICD-10-CM

## 2022-10-09 DIAGNOSIS — I2584 Coronary atherosclerosis due to calcified coronary lesion: Secondary | ICD-10-CM | POA: Diagnosis not present

## 2022-10-09 DIAGNOSIS — R0602 Shortness of breath: Secondary | ICD-10-CM | POA: Diagnosis not present

## 2022-10-09 DIAGNOSIS — R1013 Epigastric pain: Secondary | ICD-10-CM | POA: Diagnosis not present

## 2022-10-09 LAB — CBC WITH DIFFERENTIAL/PLATELET
Abs Immature Granulocytes: 0.03 10*3/uL (ref 0.00–0.07)
Basophils Absolute: 0.1 10*3/uL (ref 0.0–0.1)
Basophils Relative: 1 %
Eosinophils Absolute: 0.5 10*3/uL (ref 0.0–0.5)
Eosinophils Relative: 6 %
HCT: 36.4 % (ref 36.0–46.0)
Hemoglobin: 11.7 g/dL — ABNORMAL LOW (ref 12.0–15.0)
Immature Granulocytes: 0 %
Lymphocytes Relative: 28 %
Lymphs Abs: 2.6 10*3/uL (ref 0.7–4.0)
MCH: 30.9 pg (ref 26.0–34.0)
MCHC: 32.1 g/dL (ref 30.0–36.0)
MCV: 96 fL (ref 80.0–100.0)
Monocytes Absolute: 0.8 10*3/uL (ref 0.1–1.0)
Monocytes Relative: 8 %
Neutro Abs: 5.4 10*3/uL (ref 1.7–7.7)
Neutrophils Relative %: 57 %
Platelets: 298 10*3/uL (ref 150–400)
RBC: 3.79 MIL/uL — ABNORMAL LOW (ref 3.87–5.11)
RDW: 12.2 % (ref 11.5–15.5)
WBC: 9.4 10*3/uL (ref 4.0–10.5)
nRBC: 0 % (ref 0.0–0.2)

## 2022-10-09 LAB — COMPREHENSIVE METABOLIC PANEL
ALT: 63 U/L — ABNORMAL HIGH (ref 0–44)
AST: 31 U/L (ref 15–41)
Albumin: 3.7 g/dL (ref 3.5–5.0)
Alkaline Phosphatase: 70 U/L (ref 38–126)
Anion gap: 8 (ref 5–15)
BUN: 26 mg/dL — ABNORMAL HIGH (ref 6–20)
CO2: 17 mmol/L — ABNORMAL LOW (ref 22–32)
Calcium: 8.7 mg/dL — ABNORMAL LOW (ref 8.9–10.3)
Chloride: 110 mmol/L (ref 98–111)
Creatinine, Ser: 1.64 mg/dL — ABNORMAL HIGH (ref 0.44–1.00)
GFR, Estimated: 39 mL/min — ABNORMAL LOW (ref >60)
Glucose, Bld: 105 mg/dL — ABNORMAL HIGH (ref 70–99)
Potassium: 3.2 mmol/L — ABNORMAL LOW (ref 3.5–5.1)
Sodium: 135 mmol/L (ref 135–145)
Total Bilirubin: 0.5 mg/dL (ref 0.3–1.2)
Total Protein: 6.9 g/dL (ref 6.5–8.1)

## 2022-10-09 LAB — TROPONIN I (HIGH SENSITIVITY): Troponin I (High Sensitivity): 3 ng/L (ref <18)

## 2022-10-09 LAB — I-STAT CHEM 8, ED
BUN: 26 mg/dL — ABNORMAL HIGH (ref 6–20)
Calcium, Ion: 1.25 mmol/L (ref 1.15–1.40)
Chloride: 109 mmol/L (ref 98–111)
Creatinine, Ser: 1.6 mg/dL — ABNORMAL HIGH (ref 0.44–1.00)
Glucose, Bld: 101 mg/dL — ABNORMAL HIGH (ref 70–99)
HCT: 36 % (ref 36.0–46.0)
Hemoglobin: 12.2 g/dL (ref 12.0–15.0)
Potassium: 3.3 mmol/L — ABNORMAL LOW (ref 3.5–5.1)
Sodium: 139 mmol/L (ref 135–145)
TCO2: 18 mmol/L — ABNORMAL LOW (ref 22–32)

## 2022-10-09 LAB — LACTIC ACID, PLASMA
Lactic Acid, Venous: 3 mmol/L (ref 0.5–1.9)
Lactic Acid, Venous: 1.6 mmol/L (ref 0.5–1.9)

## 2022-10-09 LAB — I-STAT BETA HCG BLOOD, ED (MC, WL, AP ONLY): I-stat hCG, quantitative: <5 m[IU]/mL (ref <5)

## 2022-10-09 LAB — LIPASE, BLOOD: Lipase: 46 U/L (ref 11–51)

## 2022-10-09 MED ORDER — LACTATED RINGERS IV BOLUS
1000.0000 mL | Freq: Once | INTRAVENOUS | Status: AC
  Administered 2022-10-09: 1000 mL via INTRAVENOUS

## 2022-10-09 MED ORDER — IOHEXOL 350 MG/ML SOLN
80.0000 mL | Freq: Once | INTRAVENOUS | Status: AC | PRN
  Administered 2022-10-09: 80 mL via INTRAVENOUS

## 2022-10-09 MED ORDER — POTASSIUM CHLORIDE CRYS ER 20 MEQ PO TBCR
40.0000 meq | EXTENDED_RELEASE_TABLET | Freq: Once | ORAL | Status: AC
  Administered 2022-10-09: 40 meq via ORAL
  Filled 2022-10-09: qty 2

## 2022-10-09 NOTE — Telephone Encounter (Signed)
Pt c/o Shortness Of Breath: STAT if SOB developed within the last 24 hours or pt is noticeably SOB on the phone  1. Are you currently SOB (can you hear that pt is SOB on the phone)? No   2. How long have you been experiencing SOB? Awhile  3. Are you SOB when sitting or when up moving around? Comes and goes  4. Are you currently experiencing any other symptoms? Feel pulse in abdomen, keep losing weight, portions getting less and less, but feels like she is hungry.

## 2022-10-09 NOTE — Discharge Instructions (Signed)
Your lab work shows some mild dehydration affecting your kidneys.  Be sure drinking plenty of fluids.  Keep your follow-up appointments as scheduled and follow-up with your primary care doctor, especially if your symptoms do not improve or if they start to worsen.  If you develop worsening, continued, or recurrent abdominal pain, uncontrolled vomiting, fever, chest or back pain, or any other new/concerning symptoms then return to the ER for evaluation.

## 2022-10-09 NOTE — ED Notes (Signed)
Patient transported to CT 

## 2022-10-09 NOTE — Telephone Encounter (Signed)
Spoke with the patient who reports that she has been having pressure in her abdomen and chest for the past week. She states that she noticed over the weekend that she could feel a pulse in her abdomen which she has never felt before. She also reports sharp back pain that comes and goes. She states that she does have shortness of breath but it has been at her baseline. She reports that when she puts a bra on she feels like there is a ball in her chest that is squeezing/putting pressure on the rest of her organs.  She states that she has still been having GI issues with nausea and trouble eating. She also has still been getting dizzy and lightheaded at times. She has not passed out. She reports blood pressure has been good in the 130s/80s. Advised patient to go to the ER for evaluation. Patient is agreeable.

## 2022-10-09 NOTE — ED Triage Notes (Signed)
Pt came in due to feeling a "heartbeat in her stomach."  Pt reports she had a heart attack four years ago.  Pt called cardiologist office for other symptoms such nausea, shortness of breath and vomiting since January 2024 with "heartbeat in her stomach."

## 2022-10-09 NOTE — ED Provider Notes (Signed)
[place] EMERGENCY DEPARTMENT AT [facility] Provider Note   CSN: [id number] Arrival date & time: 10/09/22  1030     History  No chief complaint on file.   Isabel [name] is a 47 y.o. female.  HPI 47 year old female presents with concern for possible AAA.  Her cardiology office referred her here.  Patient has been feeling a pulsation in her abdomen for the last few days.  She has had abdominal tightness and feeling a tightness especially when she puts on her brawl in her upper abdomen.  This has been going on for a week or week and a half.  She has had poor appetite and fullness for longer than that as well as shortness of breath for several months.  She has been dealing with about a week or week and a half of bilateral scapular pain.  Denies any specific chest pain currently but states from time to time she gets chest pain, most recently about 4 days ago.  However this chest pain is nowhere near when she had the heart attack.  Home Medications Prior to Admission medications   Medication Sig Start Date End Date Taking? Authorizing Provider  acetaminophen (TYLENOL) 500 MG tablet Take 1,000 mg by mouth every 6 (six) hours as needed for headache (pain).     [provider]  amLODipine (NORVASC) 2.5 MG tablet Take 1 tablet (2.5 mg total) by mouth daily. 06/26/22   Pricilla Riffle, MD  aspirin 81 MG chewable tablet Chew 1 tablet (81 mg total) by mouth daily. 12/22/18   Azalee Course, PA  atorvastatin (LIPITOR) 20 MG tablet Take 1 tablet (20 mg total) by mouth 3 (three) times a week. 09/01/22   Pricilla Riffle, MD  Cholecalciferol (HM VITAMIN D3) 100 MCG (4000 UT) CAPS Take 1 capsule (4,000 Units total) by mouth daily in the afternoon. 08/31/22   Pricilla Riffle, MD  ezetimibe (ZETIA) 10 MG tablet Take 1 tablet (10 mg total) by mouth daily. 08/29/22   Pricilla Riffle, MD  ferrous sulfate 324 (65 Fe) MG TBEC Take 1 tablet (325 mg total) by mouth in the morning and at bedtime. 12/25/19   Hoy Register, MD  ibuprofen (ADVIL) 200 MG tablet Take 600 mg by mouth every 6 (six) hours as needed (pain).    [provider]  lisinopril (ZESTRIL) 40 MG tablet TAKE 1/2 TABLET(20 MG) BY MOUTH TWICE DAILY 06/26/22   Pricilla Riffle, MD  metoprolol succinate (TOPROL-XL) 25 MG 24 hr tablet TAKE 1/2 TABLET(12.5 MG) BY MOUTH DAILY. Please keep scheduled appointment for future refills. Thank you. 07/13/22   Pricilla Riffle, MD  nitroGLYCERIN (NITROSTAT) 0.4 MG SL tablet Place 1 tablet (0.4 mg total) under the tongue every 5 (five) minutes x 3 doses as needed for chest pain. 12/21/18   Azalee Course, PA  ondansetron (ZOFRAN ODT) 4 MG disintegrating tablet Take 1 tablet (4 mg total) by mouth every 8 (eight) hours as needed for nausea or vomiting. 10/29/19   Eliezer Bottom, MD  spironolactone (ALDACTONE) 25 MG tablet Take 1 tablet (25 mg total) by mouth daily. 06/26/22   Pricilla Riffle, MD      Allergies    Patient has no known allergies.    Review of Systems   Review of Systems  Respiratory:  Positive for shortness of breath.   Cardiovascular:  Positive for chest pain.  Gastrointestinal:  Positive for abdominal pain. Negative for diarrhea and vomiting.  Musculoskeletal:  Positive for back pain.  Neurological:  Negative for weakness and numbness.  All other systems reviewed and are negative.   Physical Exam Updated Vital Signs BP 116/83   Pulse (!) 58   Temp 98.3 F (36.8 C) (Oral)   Resp 17   Ht 5\' 6"  (1.676 m)   Wt 67.1 kg   SpO2 100%   BMI 23.89 kg/m  Physical Exam Vitals and nursing note reviewed.  Constitutional:      General: She is not in acute distress.    Appearance: She is well-developed. She is not ill-appearing or diaphoretic.  HENT:     Head: Normocephalic and atraumatic.  Cardiovascular:     Rate and Rhythm: Normal rate and regular rhythm.     Pulses:          Dorsalis pedis pulses are 2+ on the right side and 2+ on the left side.     Heart sounds: Normal heart sounds.   Pulmonary:     Effort: Pulmonary effort is normal.     Breath sounds: Normal breath sounds.  Abdominal:     Palpations: Abdomen is soft. There is no pulsatile mass.     Tenderness: There is abdominal tenderness in the epigastric area and left upper quadrant.  Musculoskeletal:     Comments: No thoracic back tenderness  Skin:    General: Skin is warm and dry.  Neurological:     Mental Status: She is alert.     ED Results / Procedures / Treatments   Labs (all labs ordered are listed, but only abnormal results are displayed) Labs Reviewed  CBC WITH DIFFERENTIAL/PLATELET - Abnormal; Notable for the following components:      Result Value   RBC 3.79 (*)    Hemoglobin 11.7 (*)    All other components within normal limits  COMPREHENSIVE METABOLIC PANEL - Abnormal; Notable for the following components:   Potassium 3.2 (*)    CO2 17 (*)    Glucose, Bld 105 (*)    BUN 26 (*)    Creatinine, Ser 1.64 (*)    Calcium 8.7 (*)    ALT 63 (*)    GFR, Estimated 39 (*)    All other components within normal limits  LACTIC ACID, PLASMA - Abnormal; Notable for the following components:   Lactic Acid, Venous 3.0 (*)    All other components within normal limits  I-STAT CHEM 8, ED - Abnormal; Notable for the following components:   Potassium 3.3 (*)    BUN 26 (*)    Creatinine, Ser 1.60 (*)    Glucose, Bld 101 (*)    TCO2 18 (*)    All other components within normal limits  LIPASE, BLOOD  LACTIC ACID, PLASMA  I-STAT BETA HCG BLOOD, ED ([facility], [facility], [facility] ONLY)  TROPONIN I (HIGH SENSITIVITY)    EKG EKG Interpretation  Date/Time:  Monday Oct 09 2022 10:46:44 EDT Ventricular Rate:  66 PR Interval:  172 QRS Duration: 93 QT Interval:  382 QTC Calculation: 401 R Axis:   69 Text Interpretation: Sinus rhythm Low voltage, precordial leads no significant change since 2021 Confirmed by Pricilla Loveless [phone]) on 10/09/2022 12:47:28 PM  Radiology CT Angio Chest/Abd/Pel for Dissection W and/or Wo  Contrast  Result Date: 10/09/2022 CLINICAL DATA:  Rule out acute aortic syndrome. Complains of nausea, shortness of breath and vomiting. EXAM: CT ANGIOGRAPHY CHEST, ABDOMEN AND PELVIS TECHNIQUE: Non-contrast CT of the chest was initially obtained. Multidetector CT imaging through the chest, abdomen  and pelvis was performed using the standard protocol during bolus administration of intravenous contrast. Multiplanar reconstructed images and MIPs were obtained and reviewed to evaluate the vascular anatomy. RADIATION DOSE REDUCTION: This exam was performed according to the departmental dose-optimization program which includes automated exposure control, adjustment of the mA and/or kV according to patient size and/or use of iterative reconstruction technique. CONTRAST:  80mL OMNIPAQUE IOHEXOL 350 MG/ML SOLN COMPARISON:  CT AP 10/26/2019 FINDINGS: CTA CHEST FINDINGS Cardiovascular: Preferential opacification of the thoracic aorta. No evidence of thoracic aortic aneurysm or dissection. The main pulmonary artery and its branches appear patent without signs of a clinically significant acute pulmonary embolus. Normal heart size. No pericardial effusion. Calcifications in the RCA coronary artery. Mediastinum/Nodes: No enlarged mediastinal, hilar, or axillary lymph nodes. Thyroid gland, trachea, and esophagus demonstrate no significant findings. Lungs/Pleura: No pleural effusion, airspace consolidation, atelectasis, or pneumothorax. No suspicious pulmonary nodule or mass identified. Musculoskeletal: No chest wall abnormality. No acute or significant osseous findings. Review of the MIP images confirms the above findings. CTA ABDOMEN AND PELVIS FINDINGS VASCULAR Aorta: Normal caliber aorta without aneurysm, dissection, vasculitis or significant stenosis. Celiac: Patent without evidence of aneurysm, dissection, vasculitis or significant stenosis. SMA: Patent without evidence of aneurysm, dissection, vasculitis or significant  stenosis. Renals: Luminal narrowing is noted involving the proximal aspect of bilateral renal arteries which measures up to 50% on the left and 40% on the right. IMA: Patent without evidence of aneurysm, dissection, vasculitis or significant stenosis. Inflow: Patent without evidence of aneurysm, dissection, vasculitis or significant stenosis. Veins: No obvious venous abnormality within the limitations of this arterial phase study. Review of the MIP images confirms the above findings. NON-VASCULAR Hepatobiliary: No focal liver abnormality is seen. Status post cholecystectomy. No biliary dilatation. Pancreas: Unremarkable. No pancreatic ductal dilatation or surrounding inflammatory changes. Spleen: Normal in size without focal abnormality. Adrenals/Urinary Tract: Normal adrenal glands. No nephrolithiasis, hydronephrosis or suspicious mass. Urinary bladder is unremarkable. Stomach/Bowel: Stomach is normal. The appendix is visualized and is normal. No bowel wall thickening, inflammation or distension identified. Lymphatic: No signs of abdominopelvic adenopathy. Reproductive: Uterus appears normal. Corpus luteal cyst noted in the right ovary. Status post bilateral tubal ligation. Other: No free fluid or fluid collections. The no signs of pneumoperitoneum. Musculoskeletal: No acute or significant osseous findings. Review of the MIP images confirms the above findings. IMPRESSION: 1. No evidence for aortic dissection or aneurysm. 2. No acute findings within the chest 7 pelvis to explain the patient's shortness of breath and vomiting. 3. Mild bilateral renal artery stenosis is suspected at the origin. 4. RCA coronary artery calcifications. Electronically Signed   By: Signa Kell M.D.   On: 10/09/2022 12:26    Procedures Procedures    Medications Ordered in ED Medications  lactated ringers bolus 1,000 mL (0 mLs Intravenous Stopped 10/09/22 1318)  lactated ringers bolus 1,000 mL (0 mLs Intravenous Stopped 10/09/22  1424)  iohexol (OMNIPAQUE) 350 MG/ML injection 80 mL (80 mLs Intravenous Contrast Given 10/09/22 1159)  potassium chloride SA (KLOR-CON M) CR tablet 40 mEq (40 mEq Oral Given 10/09/22 1321)    ED Course/ Medical Decision Making/ A&P Clinical Course as of 10/09/22 1600  Mon Oct 09, 2022  1145 Patient is well-appearing.  She does have a history of cardiac disease though vital signs are stable and exam shows only some mild tenderness.  Her lactate, which had been sent in triage came back elevated at 3 but she does not appear to have sepsis or an  obvious infection at this time.  Will hold off on antibiotics but give fluids as she has self reported that she has not been drinking very well. [SG]    Clinical Course User Index [SG] Pricilla Loveless, MD                             Medical Decision Making Amount and/or Complexity of Data Reviewed Labs: ordered.    Details: Lactate 3 but on repeat is normal.  Mild bump in creatinine up to 1.6. Radiology: ordered and independent interpretation performed.    Details: No aortic dissection. ECG/medicine tests: ordered and independent interpretation performed.    Details: No ischemia  Risk Prescription drug management.   Discussed getting the CT with contrast as above with patient, she is okay with this.  Was also given 2 L of fluids.  By her report she has not been eating and drinking well which I suspect is caused a mild AKI.  Given no infectious symptoms I think this is the most likely cause of the lactate, which was ordered in triage.  Repeat is normal.  Given no signs or symptoms of sepsis otherwise I think sepsis is unlikely as well as infection is unlikely.  Will have her follow-up with her PCP and keep her outpatient GI appointment which has not yet happened.  There is no dissection or obvious emergency on the CT today.  Will discharge home with return precautions.        Final Clinical Impression(s) / ED Diagnoses Final diagnoses:   Abdominal pain, unspecified abdominal location    Rx / DC Orders ED Discharge Orders     None         Pricilla Loveless, MD 10/09/22 1601

## 2022-11-14 ENCOUNTER — Ambulatory Visit: Payer: Medicaid Other | Attending: Internal Medicine

## 2022-11-14 DIAGNOSIS — E782 Mixed hyperlipidemia: Secondary | ICD-10-CM | POA: Diagnosis not present

## 2022-11-14 DIAGNOSIS — I2510 Atherosclerotic heart disease of native coronary artery without angina pectoris: Secondary | ICD-10-CM | POA: Diagnosis not present

## 2022-11-14 DIAGNOSIS — I10 Essential (primary) hypertension: Secondary | ICD-10-CM

## 2022-11-14 DIAGNOSIS — E559 Vitamin D deficiency, unspecified: Secondary | ICD-10-CM | POA: Diagnosis not present

## 2022-11-14 DIAGNOSIS — E785 Hyperlipidemia, unspecified: Secondary | ICD-10-CM

## 2022-11-14 DIAGNOSIS — Z79899 Other long term (current) drug therapy: Secondary | ICD-10-CM

## 2022-11-14 LAB — HEPATIC FUNCTION PANEL
ALT: 71 IU/L — ABNORMAL HIGH (ref 0–32)
Alkaline Phosphatase: 92 IU/L (ref 44–121)
Bilirubin Total: 0.4 mg/dL (ref 0.0–1.2)

## 2022-11-14 LAB — NMR, LIPOPROFILE

## 2022-11-15 LAB — HEPATIC FUNCTION PANEL
AST: 60 IU/L — ABNORMAL HIGH (ref 0–40)
Albumin: 4.2 g/dL (ref 3.9–4.9)
Bilirubin, Direct: 0.15 mg/dL (ref 0.00–0.40)
Total Protein: 6.6 g/dL (ref 6.0–8.5)

## 2022-11-15 LAB — NMR, LIPOPROFILE
Cholesterol, Total: 133 mg/dL (ref 100–199)
HDL Particle Number: 25.4 umol/L — ABNORMAL LOW (ref >=30.5)
HDL-C: 41 mg/dL (ref >39)
LDL Particle Number: 770 nmol/L (ref <1000)
LDL Size: 21.4 nm (ref >20.5)
LDL-C (NIH Calc): 76 mg/dL (ref 0–99)
Triglycerides: 81 mg/dL (ref 0–149)

## 2022-11-15 LAB — VITAMIN D 25 HYDROXY (VIT D DEFICIENCY, FRACTURES): Vit D, 25-Hydroxy: 29.1 ng/mL — ABNORMAL LOW (ref 30.0–100.0)

## 2022-11-17 ENCOUNTER — Telehealth: Payer: Self-pay

## 2022-11-17 MED ORDER — ATORVASTATIN CALCIUM 20 MG PO TABS: 20.0000 mg | ORAL_TABLET | ORAL | 3 refills | Status: DC

## 2022-11-17 NOTE — Telephone Encounter (Signed)
The patient has been notified of the results and verbalized understanding.  All questions (if any) were answered.  Pt prefers to try 20 mg every other day and keep her appt 01/31/23.

## 2022-11-17 NOTE — Telephone Encounter (Signed)
-----   Message from Pricilla Riffle, MD sent at 11/16/2022  5:16 PM EDT ----- LDL is 76    Close to goal Confirm how taking statin   ? 3x per week Would she be willing to try 40 mg 3x per week  or 20 mg every other day?

## 2023-01-30 NOTE — Progress Notes (Signed)
Cardiology Office Note   Date:  01/31/2023   ID:  Isabel [name], DOB 06-Oct-1975, MRN [id number]  PCP:  Hoy Register, MD  Cardiologist:   Dietrich Pates, MD   Pt presents for f/u of CAD    History of Present Illness: Isabel [name] is a 47 y.o. female with a history of HTN, CKD, anemia HL, hypokalemia and CAD   She is s/p NSTEMI in 12/20/18:   80% prox RCA   She is s/p DES stent   Echo showed LVEF 60 to 6   Has been noncompliant with meds in past when she had no inscurance     I saw the pt in March 2024  Since seen she has occasional CP  Comes on with mental stress.   She is active   Notes some SOB     PT had 2 syncopal spells   ONe when got up from laying    One while standing    Dizzy pior Feels a little foggy when thinking a ttimes     Concerned about cardiac risks  Mother had a CVA   She has followed her monther   Teeth pulled   She has problems wearing teeth   Has strong gag reflex  Hs to eat carefully     Current Meds  Medication Sig   acetaminophen (TYLENOL) 500 MG tablet Take 1,000 mg by mouth every 6 (six) hours as needed for headache (pain).    aspirin 81 MG chewable tablet Chew 1 tablet (81 mg total) by mouth daily.   Cholecalciferol (HM VITAMIN D3) 100 MCG (4000 UT) CAPS Take 1 capsule (4,000 Units total) by mouth daily in the afternoon.   ferrous sulfate 324 (65 Fe) MG TBEC Take 1 tablet (325 mg total) by mouth in the morning and at bedtime.   ibuprofen (ADVIL) 200 MG tablet Take 600 mg by mouth every 6 (six) hours as needed (pain).   nitroGLYCERIN (NITROSTAT) 0.4 MG SL tablet Place 1 tablet (0.4 mg total) under the tongue every 5 (five) minutes x 3 doses as needed for chest pain.   ondansetron (ZOFRAN ODT) 4 MG disintegrating tablet Take 1 tablet (4 mg total) by mouth every 8 (eight) hours as needed for nausea or vomiting.   [DISCONTINUED] amLODipine (NORVASC) 2.5 MG tablet Take 1 tablet (2.5 mg total) by mouth daily.   [DISCONTINUED] atorvastatin (LIPITOR) 20 MG  tablet Take 1 tablet (20 mg total) by mouth every other day.   [DISCONTINUED] ezetimibe (ZETIA) 10 MG tablet Take 1 tablet (10 mg total) by mouth daily.   [DISCONTINUED] lisinopril (ZESTRIL) 40 MG tablet TAKE 1/2 TABLET(20 MG) BY MOUTH TWICE DAILY   [DISCONTINUED] metoprolol succinate (TOPROL-XL) 25 MG 24 hr tablet TAKE 1/2 TABLET(12.5 MG) BY MOUTH DAILY. Please keep scheduled appointment for future refills. Thank you.   [DISCONTINUED] spironolactone (ALDACTONE) 25 MG tablet Take 1 tablet (25 mg total) by mouth daily.     Allergies:   Patient has no known allergies.   Past Medical History:  Diagnosis Date   CAD (coronary artery disease)    Echocardiogram 11/22: EF 55-60, no RWMA, GLS -25% (normal), normal RVSF, mod LAE, trivial MR   Hyperlipidemia LDL goal <70    Hypertension    NSTEMI (non-ST elevated myocardial infarction) (HCC) 12/19/2018   Tobacco abuse     Past Surgical History:  Procedure Laterality Date   CESAREAN SECTION     CHOLECYSTECTOMY     CORONARY STENT INTERVENTION N/A 12/20/2018  Procedure: CORONARY STENT INTERVENTION;  Surgeon: Runell Gess, MD;  Location: [facility] INVASIVE CV LAB;  Service: Cardiovascular;  Laterality: N/A;   LEFT HEART CATH AND CORONARY ANGIOGRAPHY N/A 12/20/2018   Procedure: LEFT HEART CATH AND CORONARY ANGIOGRAPHY;  Surgeon: Runell Gess, MD;  Location: [facility] INVASIVE CV LAB;  Service: Cardiovascular;  Laterality: N/A;     Social History:  The patient  reports that she has quit smoking. Her smoking use included cigarettes. She has a 2.5 pack-year smoking history. She has never used smokeless tobacco. She reports that she does not drink alcohol and does not use drugs.   Family History:  The patient's family history includes Breast cancer in her paternal grandmother; Cervical cancer in her paternal grandmother; Diabetes in her maternal grandmother and paternal grandmother; Heart attack (age of onset: 71) in her maternal grandmother; Heart attack  (age of onset: 45) in her mother; Heart attack (age of onset: 94) in her paternal grandfather; Hypertension in her father, mother, paternal grandfather, paternal grandmother, and sister.    ROS:  Please see the history of present illness. All other systems are reviewed and  Negative to the above problem except as noted.    PHYSICAL EXAM: VS:  BP 102/70   Pulse 63   Ht 5\' 6"  (1.676 m)   Wt 143 lb 9.6 oz (65.1 kg)   SpO2 99%   BMI 23.18 kg/m   GEN: Well nourished, well developed, in no acute distress  HEENT: normal  Neck: no JVD, no carotid bruit Cardiac: RRR; no murmur  No LE edema  Respiratory:  clear to auscultation bilaterally  GI: soft, nontender    No hepatomegaly   EKG:  EKG not done   Zio patch   Jan 2024  Patch Wear Time:  3 days and 0 hours (2024-02-01T17:53:02-499 to 2024-02-04T18:01:59-0500)   Impression:    Sinus rhythm   Rates 40 to 137 bpm  Average HR 61 bpm Rare PVC, PAC. Diary entry corresponded to SR with PVC    Echo   Nov 2022   1. Left ventricular ejection fraction, by estimation, is 55 to 60%. The  left ventricle has normal function. The left ventricle has no regional  wall motion abnormalities. Left ventricular diastolic parameters were  normal. The average left ventricular  global longitudinal strain is -25.0 %. The global longitudinal strain is  normal.   2. Right ventricular systolic function is normal. The right ventricular  size is normal.   3. Left atrial size was moderately dilated.   4. The mitral valve is normal in structure. Trivial mitral valve  regurgitation. No evidence of mitral stenosis.   5. The aortic valve is normal in structure. Aortic valve regurgitation is  not visualized. No aortic stenosis is present.   6. The inferior vena cava is normal in size with greater than 50%  respiratory variability, suggesting right atrial pressure of 3 mmHg.  Lipid Panel    Component Value Date/Time   CHOL 161 08/24/2022 0948   TRIG 102  08/24/2022 0948   HDL 38 (L) 08/24/2022 0948   CHOLHDL 4.2 08/24/2022 0948   CHOLHDL 4.3 12/19/2018 2354   VLDL 36 12/19/2018 2354   LDLCALC 104 (H) 08/24/2022 0948      Wt Readings from Last 3 Encounters:  01/31/23 143 lb 9.6 oz (65.1 kg)  10/09/22 148 lb (67.1 kg)  08/24/22 155 lb 3.2 oz (70.4 kg)      ASSESSMENT AND PLAN:  1.  CAD.  Stent in July 2020  Has some DOE.  Will set up for PET/CT stress      2  Syncope Spells sound orthostatic    BP is a little low today   Would stop amldipoine   Cut back on lisinopril to 10 mg  3  Hx HTN   As noted above, will cut back on labs  4  HL   LDL 76  HDL 41  Trig 81         6  CKD   Check CMET   7  GI  Signficant symptoms   N/V   Diarrhea   Full   Needs to go back to   Cehck CBC, CMET, TSH, Lipomed, Vit D ANA and ESR  F/U in September    Current medicines are reviewed at length with the patient today.  The patient does not have concerns regarding medicines.  Signed, Dietrich Pates, MD  01/31/2023 8:57 PM    [facility] [address] [place], [place], [place]  [address] Phone: [phone]; Fax: [phone]

## 2023-01-31 ENCOUNTER — Ambulatory Visit: Payer: Medicaid Other | Attending: Internal Medicine | Admitting: Internal Medicine

## 2023-01-31 ENCOUNTER — Encounter: Payer: Self-pay | Admitting: Internal Medicine

## 2023-01-31 VITALS — BP 102/70 | HR 63 | Ht 66.0 in | Wt 143.6 lb

## 2023-01-31 DIAGNOSIS — R0602 Shortness of breath: Secondary | ICD-10-CM

## 2023-01-31 DIAGNOSIS — I10 Essential (primary) hypertension: Secondary | ICD-10-CM | POA: Diagnosis not present

## 2023-01-31 DIAGNOSIS — E559 Vitamin D deficiency, unspecified: Secondary | ICD-10-CM

## 2023-01-31 DIAGNOSIS — E785 Hyperlipidemia, unspecified: Secondary | ICD-10-CM | POA: Diagnosis not present

## 2023-01-31 DIAGNOSIS — I2510 Atherosclerotic heart disease of native coronary artery without angina pectoris: Secondary | ICD-10-CM | POA: Diagnosis not present

## 2023-01-31 DIAGNOSIS — N1832 Chronic kidney disease, stage 3b: Secondary | ICD-10-CM

## 2023-01-31 DIAGNOSIS — Z79899 Other long term (current) drug therapy: Secondary | ICD-10-CM

## 2023-01-31 DIAGNOSIS — E782 Mixed hyperlipidemia: Secondary | ICD-10-CM

## 2023-01-31 MED ORDER — METOPROLOL SUCCINATE ER 25 MG PO TB24: ORAL_TABLET | ORAL | 3 refills | Status: DC

## 2023-01-31 MED ORDER — ATORVASTATIN CALCIUM 20 MG PO TABS: 20.0000 mg | ORAL_TABLET | ORAL | 3 refills | Status: DC

## 2023-01-31 MED ORDER — SPIRONOLACTONE 25 MG PO TABS: 25.0000 mg | ORAL_TABLET | Freq: Every day | ORAL | 3 refills | Status: AC

## 2023-01-31 MED ORDER — LISINOPRIL 10 MG PO TABS: ORAL_TABLET | ORAL | 3 refills | Status: DC

## 2023-01-31 MED ORDER — EZETIMIBE 10 MG PO TABS: 10.0000 mg | ORAL_TABLET | Freq: Every day | ORAL | 3 refills | Status: DC

## 2023-01-31 NOTE — Patient Instructions (Addendum)
Medication Instructions:  STOP AMLODIPINE DECREASE LISINOPRIL TO 10 MG A DAY   *If you need a refill on your cardiac medications before your next appointment, please call your pharmacy*   Lab Work: NMR, BMET, VIT D TODAY  If you have labs (blood work) drawn today and your tests are completely normal, you will receive your results only by: MyChart Message (if you have MyChart) OR A paper copy in the mail If you have any lab test that is abnormal or we need to change your treatment, we will call you to review the results.   Testing/Procedures: How to Prepare for Your Cardiac PET/CT Stress Test:  1. Please do not take these medications before your test:   Medications that may interfere with the cardiac pharmacological stress agent (ex. nitrates - including erectile dysfunction medications, isosorbide mononitrate, tamulosin or beta-blockers) the day of the exam. (Erectile dysfunction medication should be held for at least 72 hrs prior to test) Theophylline containing medications for 12 hours. Dipyridamole 48 hours prior to the test. Your remaining medications may be taken with water.  2. Nothing to eat or drink, except water, 3 hours prior to arrival time.   NO caffeine/decaffeinated products, or chocolate 12 hours prior to arrival.  3. NO perfume, cologne or lotion on chest or abdomen area.          - FEMALES - Please avoid wearing dresses to this appointment.  4. Total time is 1 to 2 hours; you may want to bring reading material for the waiting time.  5. Please report to Radiology at the [facility] Main Entrance 30 minutes early for your test.  [address] [place], [place] [address]  6. Please report to Radiology at [facility] Main Entrance, medical mall, 30 mins prior to your test.  [address]  [place], [place]  [phone]   In preparation for your appointment, medication and supplies will be purchased.  Appointment  availability is limited, so if you need to cancel or reschedule, please call the Radiology Department at [phone] [address]) OR [phone] [place])  24 hours in advance to avoid a cancellation fee of $100.00  What to Expect After you Arrive:  Once you arrive and check in for your appointment, you will be taken to a preparation room within the Radiology Department.  A technologist or Nurse will obtain your medical history, verify that you are correctly prepped for the exam, and explain the procedure.  Afterwards,  an IV will be started in your arm and electrodes will be placed on your skin for EKG monitoring during the stress portion of the exam. Then you will be escorted to the PET/CT scanner.  There, staff will get you positioned on the scanner and obtain a blood pressure and EKG.  During the exam, you will continue to be connected to the EKG and blood pressure machines.  A small, safe amount of a radioactive tracer will be injected in your IV to obtain a series of pictures of your heart along with an injection of a stress agent.    After your Exam:  It is recommended that you eat a meal and drink a caffeinated beverage to counter act any effects of the stress agent.  Drink plenty of fluids for the remainder of the day and urinate frequently for the first couple of hours after the exam.  Your doctor will inform you of your test results within 7-10 business days.  For more information  and frequently asked questions, please visit our website : [email or web address]  For questions about your test or how to prepare for your test, please call: Cardiac Imaging Nurse Navigators Office: [phone]    Follow-Up: At [facility], you and your health needs are our priority.  As part of our continuing mission to provide you with exceptional heart care, we have created designated Provider Care Teams.  These Care Teams include your primary Cardiologist (physician) and Advanced  Practice Providers (APPs -  Physician Assistants and Nurse Practitioners) who all work together to provide you with the care you need, when you need it.  We recommend signing up for the patient portal called "MyChart".  Sign up information is provided on this After Visit Summary.  MyChart is used to connect with patients for Virtual Visits (Telemedicine).  Patients are able to view lab/test results, encounter notes, upcoming appointments, etc.  Non-urgent messages can be sent to your provider as well.   To learn more about what you can do with MyChart, go to [email or web address].    Your next appointment:    NURSE BP CHECK IN 6- 8 WEEKS  DR. ROSS IN [place] 2025 ...6 MONTHS

## 2023-02-01 LAB — NMR, LIPOPROFILE
Cholesterol, Total: 122 mg/dL (ref 100–199)
HDL Particle Number: 24.8 umol/L — ABNORMAL LOW (ref >=30.5)
HDL-C: 43 mg/dL (ref >39)
LDL Particle Number: 707 nmol/L (ref <1000)
LDL Size: 21.2 nm (ref >20.5)
LDL-C (NIH Calc): 64 mg/dL (ref 0–99)
LP-IR Score: 27 (ref <=45)
Small LDL Particle Number: 362 nmol/L (ref <=527)
Triglycerides: 71 mg/dL (ref 0–149)

## 2023-02-01 LAB — BASIC METABOLIC PANEL
BUN/Creatinine Ratio: 12 (ref 9–23)
BUN: 15 mg/dL (ref 6–24)
CO2: 20 mmol/L (ref 20–29)
Calcium: 9.1 mg/dL (ref 8.7–10.2)
Chloride: 107 mmol/L — ABNORMAL HIGH (ref 96–106)
Creatinine, Ser: 1.29 mg/dL — ABNORMAL HIGH (ref 0.57–1.00)
Glucose: 81 mg/dL (ref 70–99)
Potassium: 4.2 mmol/L (ref 3.5–5.2)
Sodium: 136 mmol/L (ref 134–144)
eGFR: 52 mL/min/{1.73_m2} — ABNORMAL LOW (ref >59)

## 2023-02-01 LAB — VITAMIN D 25 HYDROXY (VIT D DEFICIENCY, FRACTURES): Vit D, 25-Hydroxy: 25.9 ng/mL — ABNORMAL LOW (ref 30.0–100.0)

## 2023-02-09 ENCOUNTER — Encounter (HOSPITAL_COMMUNITY): Payer: Self-pay

## 2023-02-12 ENCOUNTER — Telehealth (HOSPITAL_COMMUNITY): Payer: Self-pay | Admitting: *Deleted

## 2023-02-12 NOTE — Telephone Encounter (Signed)
Reaching out to patient to offer assistance regarding upcoming cardiac imaging study; pt verbalizes understanding of appt date/time, parking situation and where to check in, pre-test NPO status  and verified current allergies; name and call back number provided for further questions should they arise  Shaneeka Scarboro RN Navigator Cardiac Imaging [facility] [phone] office [phone] cell  Patient aware to avoid caffeine 12 hours prior to her cardiac PET scan. 

## 2023-02-13 ENCOUNTER — Encounter (HOSPITAL_COMMUNITY)
Admission: RE | Admit: 2023-02-13 | Discharge: 2023-02-13 | Disposition: A | Discharge status: Home / Self Care | Payer: Medicaid Other | Source: Ambulatory Visit | Attending: Internal Medicine | Admitting: Internal Medicine

## 2023-02-13 DIAGNOSIS — E559 Vitamin D deficiency, unspecified: Secondary | ICD-10-CM

## 2023-02-13 DIAGNOSIS — I10 Essential (primary) hypertension: Secondary | ICD-10-CM | POA: Diagnosis not present

## 2023-02-13 DIAGNOSIS — R0602 Shortness of breath: Secondary | ICD-10-CM

## 2023-02-13 DIAGNOSIS — E782 Mixed hyperlipidemia: Secondary | ICD-10-CM

## 2023-02-13 DIAGNOSIS — N1832 Chronic kidney disease, stage 3b: Secondary | ICD-10-CM | POA: Diagnosis not present

## 2023-02-13 DIAGNOSIS — Z79899 Other long term (current) drug therapy: Secondary | ICD-10-CM | POA: Diagnosis not present

## 2023-02-13 DIAGNOSIS — E785 Hyperlipidemia, unspecified: Secondary | ICD-10-CM | POA: Insufficient documentation

## 2023-02-13 DIAGNOSIS — I2510 Atherosclerotic heart disease of native coronary artery without angina pectoris: Secondary | ICD-10-CM

## 2023-02-13 LAB — NM PET CT CARDIAC PERFUSION MULTI W/ABSOLUTE BLOODFLOW
LV dias vol: 86 mL (ref 46–106)
MBFR: 2.86
Nuc Rest EF: 62 %
Nuc Stress EF: 70 %
Peak HR: 114 {beats}/min
Rest HR: 59 {beats}/min
Rest MBF: 1.02 ml/g/min
Rest Nuclear Isotope Dose: 17 mCi
Rest perfusion cavity size (mL): 86 mL
ST Depression (mm): 0 mm
Stress MBF: 2.92 ml/g/min
Stress Nuclear Isotope Dose: 17 mCi
Stress perfusion cavity size (mL): 90 mL
TID: 0.92

## 2023-02-13 MED ORDER — REGADENOSON 0.4 MG/5ML IV SOLN
0.4000 mg | Freq: Once | INTRAVENOUS | Status: AC
  Administered 2023-02-13: 0.4 mg via INTRAVENOUS

## 2023-02-13 MED ORDER — RUBIDIUM RB82 GENERATOR (RUBYFILL)
17.0300 | PACK | Freq: Once | INTRAVENOUS | Status: AC
  Administered 2023-02-13: 17.03 via INTRAVENOUS

## 2023-02-13 MED ORDER — RUBIDIUM RB82 GENERATOR (RUBYFILL)
17.0000 | PACK | Freq: Once | INTRAVENOUS | Status: AC
  Administered 2023-02-13: 17 via INTRAVENOUS

## 2023-02-13 MED ORDER — RUBIDIUM RB82 GENERATOR (RUBYFILL): 17.0300 | PACK | Freq: Once | INTRAVENOUS | Status: DC

## 2023-02-13 MED ORDER — REGADENOSON 0.4 MG/5ML IV SOLN
INTRAVENOUS | Status: AC
  Filled 2023-02-13: qty 5

## 2023-03-21 ENCOUNTER — Ambulatory Visit: Payer: Medicaid Other | Attending: Cardiology

## 2023-03-21 DIAGNOSIS — I2510 Atherosclerotic heart disease of native coronary artery without angina pectoris: Secondary | ICD-10-CM

## 2023-03-21 NOTE — Progress Notes (Signed)
Nurse visit today per Dr Tenny Craw... last OV pt meds changed due to dizziness and low BP... 102/70 and HR 63 on 01/31/23.   Today BP 154/94 and 152-90 HR 67.   Pt says she has been feeling much better less dizziness, not much SOB.   Her meds were changed on 01/31/24 to: Medication Instructions:  STOP AMLODIPINE DECREASE LISINOPRIL TO 10 MG A DAY    She is still on Metoprolol 12.5 mg daily   I spoke with Dr Elberta Fortis the DOD and okay if I further review with Dr Tenny Craw since she has been following the pt... I will talk with her today and follow up with th pt alter today.    I made the pt a follow up OV Feb 2025.

## 2023-03-21 NOTE — Patient Instructions (Signed)
Medication Instructions:   *If you need a refill on your cardiac medications before your next appointment, please call your pharmacy*   Lab Work:  If you have labs (blood work) drawn today and your tests are completely normal, you will receive your results only by: MyChart Message (if you have MyChart) OR A paper copy in the mail If you have any lab test that is abnormal or we need to change your treatment, we will call you to review the results.   Testing/Procedures:    Follow-Up: At [facility], you and your health needs are our priority.  As part of our continuing mission to provide you with exceptional heart care, we have created designated Provider Care Teams.  These Care Teams include your primary Cardiologist (physician) and Advanced Practice Providers (APPs -  Physician Assistants and Nurse Practitioners) who all work together to provide you with the care you need, when you need it.  We recommend signing up for the patient portal called "MyChart".  Sign up information is provided on this After Visit Summary.  MyChart is used to connect with patients for Virtual Visits (Telemedicine).  Patients are able to view lab/test results, encounter notes, upcoming appointments, etc.  Non-urgent messages can be sent to your provider as well.   To learn more about what you can do with MyChart, go to [email or web address].    Your next appointment:  appt made for Spring 2025

## 2023-04-25 NOTE — Progress Notes (Signed)
If BP is still elevated I would recomm restarting amlodipine 2.5 mg    THat is a low dose  Continue to follow BP

## 2023-04-30 ENCOUNTER — Telehealth: Payer: Self-pay

## 2023-04-30 NOTE — Telephone Encounter (Signed)
My Chart sent to the pt.. will follow up with her.

## 2023-04-30 NOTE — Telephone Encounter (Signed)
-----   Message from Dietrich Pates sent at 04/25/2023  4:59 PM EST -----    ----- Message ----- From: Bertram Millard, RN Sent: 03/21/2023  11:55 AM EST To: Pricilla Riffle, MD

## 2023-07-17 ENCOUNTER — Ambulatory Visit: Payer: Medicaid Other | Admitting: Internal Medicine

## 2023-08-09 NOTE — Progress Notes (Unsigned)
 Cardiology Office Note   Date:  08/10/2023   ID:  Isabel [name], DOB Dec 03, 1975, MRN [id number]  PCP:  Hoy Register, MD  Cardiologist:   Dietrich Pates, MD   Pt presents for f/u of CAD    History of Present Illness: Isabel [name] is a 48 y.o. female with a history of HTN, CKD, anemia HL, hypokalemia and CAD  and noncompliance in past due to lack of insurance    July 2020 s/p NSTEMI LHC showed  80% prox RCA   She is s/p DES stent   Echo showed LVEF 60 to 65%   I saw the pt in Sept 2024  She had had 2 syncopal spells prior  One with standing, one while standing     She also noted some SOB  with exertion    She was set up for a Lexiscan PET/CT     This was done in Sept 2024   IT showed infarct in the inferoapical region with mild peri infarct ischemia   LVEF 62%   PT had normal flow reserve   Plan for medical Rx  Since seen she says she has been doing pretty good   She denies CP   Denies palpitations.  She has had a couple dizzy spells but no syncope  Not the she had stopped amlodipine after last fvisit but then had to start back because BP was high She is also taking lisinopril 2x per da  She is going through menopausal symptoms/changes as well  Current Meds  Medication Sig   acetaminophen (TYLENOL) 500 MG tablet Take 1,000 mg by mouth every 6 (six) hours as needed for headache (pain).    amLODipine (NORVASC) 2.5 MG tablet Take 2.5 mg by mouth daily.   aspirin 81 MG chewable tablet Chew 1 tablet (81 mg total) by mouth daily.   atorvastatin (LIPITOR) 20 MG tablet Take 1 tablet (20 mg total) by mouth every other day.   Cholecalciferol (HM VITAMIN D3) 100 MCG (4000 UT) CAPS Take 1 capsule (4,000 Units total) by mouth daily in the afternoon.   ezetimibe (ZETIA) 10 MG tablet Take 1 tablet (10 mg total) by mouth daily.   ferrous sulfate 324 (65 Fe) MG TBEC Take 1 tablet (325 mg total) by mouth in the morning and at bedtime.   ibuprofen (ADVIL) 200 MG tablet Take 600 mg by mouth  every 6 (six) hours as needed (pain).   lisinopril (ZESTRIL) 10 MG tablet TAKE 1/2 TABLET(20 MG) BY MOUTH TWICE DAILY   metoprolol succinate (TOPROL-XL) 25 MG 24 hr tablet TAKE 1/2 TABLET(12.5 MG) BY MOUTH DAILY. Please keep scheduled appointment for future refills. Thank you.   nitroGLYCERIN (NITROSTAT) 0.4 MG SL tablet Place 1 tablet (0.4 mg total) under the tongue every 5 (five) minutes x 3 doses as needed for chest pain.   ondansetron (ZOFRAN ODT) 4 MG disintegrating tablet Take 1 tablet (4 mg total) by mouth every 8 (eight) hours as needed for nausea or vomiting.   spironolactone (ALDACTONE) 25 MG tablet Take 1 tablet (25 mg total) by mouth daily.     Allergies:   Patient has no known allergies.   Past Medical History:  Diagnosis Date   CAD (coronary artery disease)    Echocardiogram 11/22: EF 55-60, no RWMA, GLS -25% (normal), normal RVSF, mod LAE, trivial MR   Hyperlipidemia LDL goal <70    Hypertension    NSTEMI (non-ST elevated myocardial infarction) (HCC) 12/19/2018   Tobacco abuse  Past Surgical History:  Procedure Laterality Date   CESAREAN SECTION     CHOLECYSTECTOMY     CORONARY STENT INTERVENTION N/A 12/20/2018   Procedure: CORONARY STENT INTERVENTION;  Surgeon: Runell Gess, MD;  [facility];  Service: Cardiovascular;  Laterality: N/A;   LEFT HEART CATH AND CORONARY ANGIOGRAPHY N/A 12/20/2018   Procedure: LEFT HEART CATH AND CORONARY ANGIOGRAPHY;  Surgeon: Runell Gess, MD;  Location: [facility];  Service: Cardiovascular;  Laterality: N/A;     Social History:  The patient  reports that she has quit smoking. Her smoking use included cigarettes. She has a 2.5 pack-year smoking history. She has never used smokeless tobacco. She reports that she does not drink alcohol and does not use drugs.   Family History:  The patient's family history includes Breast cancer in her paternal grandmother; Cervical cancer in her paternal grandmother;  Diabetes in her maternal grandmother and paternal grandmother; Heart attack (age of onset: 55) in her maternal grandmother; Heart attack (age of onset: 61) in her mother; Heart attack (age of onset: 38) in her paternal grandfather; Hypertension in her father, mother, paternal grandfather, paternal grandmother, and sister.    ROS:  Please see the history of present illness. All other systems are reviewed and  Negative to the above problem except as noted.    PHYSICAL EXAM: VS:  BP 104/63 (BP Location: Left Arm)   Pulse (!) 52   Ht 5\' 6"  (1.676 m)   Wt 66 kg   SpO2 100%   BMI 23.50 kg/m   GEN: Well nourished, well developed, in no acute distress  HEENT: normal  Neck: no JVD, no carotid bruits Cardiac: RRR; no murmurs  No LE edema  Respiratory:  clear to auscultation  GI: soft, nontender    No hepatomegaly   EKG:  EKG today shows SB      PET/CT   Sept 2024    LV perfusion is abnormal. There is evidence of ischemia. There is evidence of infarction. Defect 1: There is a small defect with moderate reduction in uptake present in the apical inferior and apex location(s) that is partially reversible. There is abnormal wall motion in the defect area. Consistent with infarction and peri-infarct ischemia.   Rest left ventricular function is normal. Rest EF: 62%. Stress left ventricular function is normal. Stress EF: 70%. End diastolic cavity size is normal.   Myocardial blood flow was computed to be 1.40ml/g/min at rest and 2.34ml/g/min at stress. Global myocardial blood flow reserve was 2.86 and was normal.   Coronary calcium assessment not performed due to prior revascularization.   Findings are consistent with infarction with peri-infarct ischemia. The study is low risk.   Zio patch   Jan 2024  Patch Wear Time:  3 days and 0 hours (2024-02-01T17:53:02-499 to 2024-02-04T18:01:59-0500)   Impression:    Sinus rhythm   Rates 40 to 137 bpm  Average HR 61 bpm Rare PVC, PAC. Diary entry  corresponded to SR with PVC    Echo   Nov 2022   1. Left ventricular ejection fraction, by estimation, is 55 to 60%. The  left ventricle has normal function. The left ventricle has no regional  wall motion abnormalities. Left ventricular diastolic parameters were  normal. The average left ventricular  global longitudinal strain is -25.0 %. The global longitudinal strain is  normal.   2. Right ventricular systolic function is normal. The right ventricular  size is normal.   3.  Left atrial size was moderately dilated.   4. The mitral valve is normal in structure. Trivial mitral valve  regurgitation. No evidence of mitral stenosis.   5. The aortic valve is normal in structure. Aortic valve regurgitation is  not visualized. No aortic stenosis is present.   6. The inferior vena cava is normal in size with greater than 50%  respiratory variability, suggesting right atrial pressure of 3 mmHg.  Lipid Panel    Component Value Date/Time   CHOL 161 08/24/2022 0948   TRIG 102 08/24/2022 0948   HDL 38 (L) 08/24/2022 0948   CHOLHDL 4.2 08/24/2022 0948   CHOLHDL 4.3 12/19/2018 2354   VLDL 36 12/19/2018 2354   LDLCALC 104 (H) 08/24/2022 0948      Wt Readings from Last 3 Encounters:  08/10/23 66 kg  01/31/23 65.1 kg  10/09/22 67.1 kg      ASSESSMENT AND PLAN:  1.  CAD.  Stent in July 2020  PET/CT stress test in Sept 2024 showed small inferior /inferoapical scar with mild periinfarct ischemia   PT without CP now   Follow   2  Syncope Spells sounded orthostatic    She is doing better but still with some dizziness    She is back on amlodipine   I would cut back on lisinoprl to daily  3  Hx HTN   As noted above, Follow   4  HL   LDL 64  HDL 43  Trig 71  5  CKD   Cr 1.29        Current medicines are reviewed at length with the patient today.  The patient does not have concerns regarding medicines.  Signed, Dietrich Pates, MD  08/10/2023 10:17 AM    [facility]  [facility] [address] [place], [place], [place]  [address] Phone: [phone]; Fax: [phone]

## 2023-08-10 ENCOUNTER — Ambulatory Visit: Payer: Medicaid Other | Attending: Internal Medicine | Admitting: Internal Medicine

## 2023-08-10 VITALS — BP 104/63 | HR 52 | Ht 66.0 in | Wt 145.6 lb

## 2023-08-10 DIAGNOSIS — N1832 Chronic kidney disease, stage 3b: Secondary | ICD-10-CM | POA: Diagnosis not present

## 2023-08-10 DIAGNOSIS — E782 Mixed hyperlipidemia: Secondary | ICD-10-CM

## 2023-08-10 DIAGNOSIS — I2510 Atherosclerotic heart disease of native coronary artery without angina pectoris: Secondary | ICD-10-CM | POA: Diagnosis not present

## 2023-08-10 DIAGNOSIS — I10 Essential (primary) hypertension: Secondary | ICD-10-CM

## 2023-08-10 MED ORDER — LISINOPRIL 10 MG PO TABS: ORAL_TABLET | ORAL | 3 refills | Status: AC

## 2023-08-10 NOTE — Patient Instructions (Signed)
 Medication Instructions:  DECREASE LISINOPRIL TO 10 MG 0.5 MG IN THE MORNING  *If you need a refill on your cardiac medications before your next appointment, please call your pharmacy*   Lab Work:  If you have labs (blood work) drawn today and your tests are completely normal, you will receive your results only by: MyChart Message (if you have MyChart) OR A paper copy in the mail If you have any lab test that is abnormal or we need to change your treatment, we will call you to review the results.   Testing/Procedures:    Follow-Up: At [facility], you and your health needs are our priority.  As part of our continuing mission to provide you with exceptional heart care, we have created designated Provider Care Teams.  These Care Teams include your primary Cardiologist (physician) and Advanced Practice Providers (APPs -  Physician Assistants and Nurse Practitioners) who all work together to provide you with the care you need, when you need it.  We recommend signing up for the patient portal called "MyChart".  Sign up information is provided on this After Visit Summary.  MyChart is used to connect with patients for Virtual Visits (Telemedicine).  Patients are able to view lab/test results, encounter notes, upcoming appointments, etc.  Non-urgent messages can be sent to your provider as well.   To learn more about what you can do with MyChart, go to [email or web address].    Your next appointment: FALL/ SEPTEMBER 2025

## 2023-12-16 ENCOUNTER — Other Ambulatory Visit: Payer: Self-pay | Admitting: Internal Medicine

## 2024-03-21 ENCOUNTER — Other Ambulatory Visit: Payer: Self-pay

## 2024-03-24 MED ORDER — METOPROLOL SUCCINATE ER 25 MG PO TB24: ORAL_TABLET | ORAL | 1 refills | Status: AC

## 2024-03-24 MED ORDER — ATORVASTATIN CALCIUM 20 MG PO TABS: 20.0000 mg | ORAL_TABLET | ORAL | 1 refills | Status: DC

## 2024-03-24 MED ORDER — EZETIMIBE 10 MG PO TABS: 10.0000 mg | ORAL_TABLET | Freq: Every day | ORAL | 1 refills | Status: DC

## 2024-03-30 NOTE — Progress Notes (Unsigned)
 Cardiology Office Note   Date:  04/04/2024   ID:  [name], DOB 09/04/1975, MRN [id number]  PCP:  Isabel Clam, MD  Cardiologist:   Isabel Gull, MD   Pt presents for f/u of CAD    History of Present Illness: Isabel [name] is a 48 y.o. female with a history of HTN, CKD, anemia HL, hypokalemia and CAD  and noncompliance in past due to lack of insurance     July 2020 s/p NSTEMI LHC showed  80% prox RCA   She is s/p DES stent   Echo showed LVEF 60 to 65%   I saw the pt in Sept 2024  She had had 2 syncopal spells prior  One with standing, one while standing     She also noted some SOB  with exertion   Sept 2024   Pt set up for  Lexiscan  PET/CT    IT showed infarct in the inferoapical region with mild peri infarct ischemia   LVEF 62%   PT had normal flow reserve   Plan for medical Rx  I saw the pt in April 2025  Since seen she says she has been doing good   Denies CP   BP a little hhigh   136-145  She has no insurance   has not taken meds in a couple weeks because she cannot afford  Active  Caring for father in law who is in hospice  Current Meds  Medication Sig   acetaminophen  (TYLENOL ) 500 MG tablet Take 1,000 mg by mouth every 6 (six) hours as needed for headache (pain).    aspirin  81 MG chewable tablet Chew 1 tablet (81 mg total) by mouth daily.   Cholecalciferol (HM VITAMIN D3) 100 MCG (4000 UT) CAPS Take 1 capsule (4,000 Units total) by mouth daily in the afternoon.   ferrous sulfate  324 (65 Fe) MG TBEC Take 1 tablet (325 mg total) by mouth in the morning and at bedtime.   ibuprofen (ADVIL) 200 MG tablet Take 600 mg by mouth every 6 (six) hours as needed (pain).   lisinopril  (ZESTRIL ) 10 MG tablet TAKE 1/2 TABLET(5 MG) BY MOUTH IN THE MORNING ONLY   metoprolol  succinate (TOPROL -XL) 25 MG 24 hr tablet TAKE 1/2 TABLET(12.5 MG) BY MOUTH DAILY.   nitroGLYCERIN  (NITROSTAT ) 0.4 MG SL tablet Place 1 tablet (0.4 mg total) under the tongue every 5 (five) minutes x 3 doses  as needed for chest pain.   spironolactone  (ALDACTONE ) 25 MG tablet Take 1 tablet (25 mg total) by mouth daily.     Allergies:   Patient has no known allergies.   Past Medical History:  Diagnosis Date   CAD (coronary artery disease)    Echocardiogram 11/22: EF 55-60, no RWMA, GLS -25% (normal), normal RVSF, mod LAE, trivial MR   Hyperlipidemia LDL goal <70    Hypertension    NSTEMI (non-ST elevated myocardial infarction) (HCC) 12/19/2018   Tobacco abuse     Past Surgical History:  Procedure Laterality Date   CESAREAN SECTION     CHOLECYSTECTOMY     CORONARY STENT INTERVENTION N/A 12/20/2018   Procedure: CORONARY STENT INTERVENTION;  Surgeon: Court Dorn PARAS, MD;  Location: [facility];  Service: Cardiovascular;  Laterality: N/A;   LEFT HEART CATH AND CORONARY ANGIOGRAPHY N/A 12/20/2018   Procedure: LEFT HEART CATH AND CORONARY ANGIOGRAPHY;  Surgeon: Court Dorn PARAS, MD;  Location: [facility];  Service: Cardiovascular;  Laterality: N/A;  Social History:  The patient  reports that she has quit smoking. Her smoking use included cigarettes. She has a 2.5 pack-year smoking history. She has never used smokeless tobacco. She reports that she does not drink alcohol and does not use drugs.   Family History:  The patient's family history includes Breast cancer in her paternal grandmother; Cervical cancer in her paternal grandmother; Diabetes in her maternal grandmother and paternal grandmother; Heart attack (age of onset: 2) in her maternal grandmother; Heart attack (age of onset: 77) in her mother; Heart attack (age of onset: 23) in her paternal grandfather; Hypertension in her father, mother, paternal grandfather, paternal grandmother, and sister.    ROS:  Please see the history of present illness. All other systems are reviewed and  Negative to the above problem except as noted.    PHYSICAL EXAM: VS:  BP (!) 142/92 (BP Location: Left Arm, Patient Position: Sitting,  Cuff Size: Normal)   Pulse 66   Ht 5' 6 (1.676 m)   Wt 145 lb 6.4 oz (66 kg)   SpO2 99%   BMI 23.47 kg/m   GEN: Well nourished, well developed, in no acute distress  HEENT: normal  Neck: no JVD, no carotid bruits Cardiac: RRR; no murmurs Respiratory:  clear to auscultation  GI: soft, nontender    No hepatomegaly  Ext  No LE edema   EKG:  EKG is not done today     PET/CT   Sept 2024    LV perfusion is abnormal. There is evidence of ischemia. There is evidence of infarction. Defect 1: There is a small defect with moderate reduction in uptake present in the apical inferior and apex location(s) that is partially reversible. There is abnormal wall motion in the defect area. Consistent with infarction and peri-infarct ischemia.   Rest left ventricular function is normal. Rest EF: 62%. Stress left ventricular function is normal. Stress EF: 70%. End diastolic cavity size is normal.   Myocardial blood flow was computed to be 1.49ml/g/min at rest and 2.92ml/g/min at stress. Global myocardial blood flow reserve was 2.86 and was normal.   Coronary calcium  assessment not performed due to prior revascularization.   Findings are consistent with infarction with peri-infarct ischemia. The study is low risk.   Zio patch   Jan 2024  Patch Wear Time:  3 days and 0 hours (2024-02-01T17:53:02-499 to 2024-02-04T18:01:59-0500)   Impression:    Sinus rhythm   Rates 40 to 137 bpm  Average HR 61 bpm Rare PVC, PAC. Diary entry corresponded to SR with PVC    Echo   Nov 2022   1. Left ventricular ejection fraction, by estimation, is 55 to 60%. The  left ventricle has normal function. The left ventricle has no regional  wall motion abnormalities. Left ventricular diastolic parameters were  normal. The average left ventricular  global longitudinal strain is -25.0 %. The global longitudinal strain is  normal.   2. Right ventricular systolic function is normal. The right ventricular  size is normal.    3. Left atrial size was moderately dilated.   4. The mitral valve is normal in structure. Trivial mitral valve  regurgitation. No evidence of mitral stenosis.   5. The aortic valve is normal in structure. Aortic valve regurgitation is  not visualized. No aortic stenosis is present.   6. The inferior vena cava is normal in size with greater than 50%  respiratory variability, suggesting right atrial pressure of 3 mmHg.  Lipid Panel  Component Value Date/Time   CHOL 161 08/24/2022 0948   TRIG 102 08/24/2022 0948   HDL 38 (L) 08/24/2022 0948   CHOLHDL 4.2 08/24/2022 0948   CHOLHDL 4.3 12/19/2018 2354   VLDL 36 12/19/2018 2354   LDLCALC 104 (H) 08/24/2022 0948      Wt Readings from Last 3 Encounters:  04/04/24 145 lb 6.4 oz (66 kg)  08/10/23 145 lb 9.6 oz (66 kg)  01/31/23 143 lb 9.6 oz (65.1 kg)      ASSESSMENT AND PLAN:  1.  CAD.  Stent in July 2020  PET/CT stress test in Sept 2024 showed small inferior /inferoapical scar with mild periinfarct ischemia    PT asymptomatic  Follow   2 HTN  BP is elwevated  She is out of meds due to price   Will ask pharmcy to review options  3  Hx syncope in past  Sound orhtostatic  Was off of BP meds at times    Denies dizziness but BP high     4  HL   LDL 64  HDL 43  Trig 71  No labs for now   5  CKD   Cr 1.29        Current medicines are reviewed at length with the patient today.  The patient does not have concerns regarding medicines.  Signed, Isabel Gull, MD  04/04/2024 10:38 PM    [facility] [address] [place], [place], [place]  [address] Phone: [phone]; Fax: [phone]

## 2024-04-04 ENCOUNTER — Ambulatory Visit: Payer: Self-pay | Attending: Internal Medicine | Admitting: Internal Medicine

## 2024-04-04 ENCOUNTER — Encounter: Payer: Self-pay | Admitting: Internal Medicine

## 2024-04-04 VITALS — BP 142/92 | HR 66 | Ht 66.0 in | Wt 145.4 lb

## 2024-04-04 DIAGNOSIS — I2510 Atherosclerotic heart disease of native coronary artery without angina pectoris: Secondary | ICD-10-CM

## 2024-04-04 NOTE — Patient Instructions (Signed)
 Medication Instructions:  Your physician recommends that you continue on your current medications as directed. Please refer to the Current Medication list given to you today.  *If you need a refill on your cardiac medications before your next appointment, please call your pharmacy*  Follow-Up: At [facility], you and your health needs are our priority.  As part of our continuing mission to provide you with exceptional heart care, our providers are all part of one team.  This team includes your primary Cardiologist (physician) and Advanced Practice Providers or APPs (Physician Assistants and Nurse Practitioners) who all work together to provide you with the care you need, when you need it.  Your next appointment:   11 month(s)  Provider:   Vina Gull, MD   We recommend signing up for the patient portal called MyChart.  Sign up information is provided on this After Visit Summary.  MyChart is used to connect with patients for Virtual Visits (Telemedicine).  Patients are able to view lab/test results, encounter notes, upcoming appointments, etc.  Non-urgent messages can be sent to your provider as well.    To learn more about what you can do with MyChart, go to [email or web address].

## 2024-04-07 ENCOUNTER — Telehealth: Payer: Self-pay | Admitting: Licensed Clinical Social Worker

## 2024-04-07 NOTE — Telephone Encounter (Signed)
 H&V Care Navigation CSW Progress Note  Clinical Social Worker contacted patient by phone to f/u on self pay status. Note in provider assessment pt having challenges with medication affordability as well. Left voicemail for pt today at [phone]. Will re-attempt again as able.  Patient is participating in a Managed Medicaid Plan:  No, self pay only  SDOH Screenings   Depression (PHQ2-9): Low Risk  (12/25/2019)  Tobacco Use: Medium Risk (04/04/2024)    Marit Lark, MSW, LCSW Clinical Social Worker II [facility] Heart/Vascular Care Navigation  [phone]- work cell phone (preferred)

## 2024-04-08 ENCOUNTER — Telehealth: Payer: Self-pay | Admitting: Licensed Clinical Social Worker

## 2024-04-08 NOTE — Telephone Encounter (Signed)
 H&V Care Navigation CSW Progress Note  Clinical Social Worker contacted patient by phone to f/u on self pay status. Note in provider assessment pt having challenges with medication affordability as well. Left 2nd voicemail for pt today at [phone]. Will re-attempt again as able.  Patient is participating in a Managed Medicaid Plan:  No, self pay only  SDOH Screenings   Depression (PHQ2-9): Low Risk  (12/25/2019)  Tobacco Use: Medium Risk (04/04/2024)    Marit Lark, MSW, LCSW Clinical Social Worker II [facility] Heart/Vascular Care Navigation  [phone]- work cell phone (preferred)

## 2024-04-10 ENCOUNTER — Telehealth (HOSPITAL_BASED_OUTPATIENT_CLINIC_OR_DEPARTMENT_OTHER): Payer: Self-pay | Admitting: Licensed Clinical Social Worker

## 2024-04-10 NOTE — Telephone Encounter (Signed)
 H&V Care Navigation CSW Progress Note  Clinical Social Worker contacted patient by phone to f/u on self pay status. Note in provider assessment pt having challenges with medication affordability as well. Left 3rd voicemail for pt today at [phone]. Will send pt a copy of [employer], [place], Medicaid information and  my card.  Patient is participating in a Managed Medicaid Plan:  No, self pay only  SDOH Screenings   Depression (PHQ2-9): Low Risk  (12/25/2019)  Tobacco Use: Medium Risk (04/04/2024)    Marit Lark, MSW, LCSW Clinical Social Worker II [facility] Heart/Vascular Care Navigation  [phone]- work cell phone (preferred)
